# Patient Record
Sex: Male | Born: 1945 | Race: White | Hispanic: No | Smoking: Former smoker
Health system: Southern US, Community
[De-identification: ages and names within clinical notes are randomized; demographics above are authoritative.]

## PROBLEM LIST (undated history)

## (undated) DIAGNOSIS — I499 Cardiac arrhythmia, unspecified: Secondary | ICD-10-CM

## (undated) DIAGNOSIS — Z8719 Personal history of other diseases of the digestive system: Secondary | ICD-10-CM

## (undated) DIAGNOSIS — E119 Type 2 diabetes mellitus without complications: Secondary | ICD-10-CM

## (undated) DIAGNOSIS — M1990 Unspecified osteoarthritis, unspecified site: Secondary | ICD-10-CM

## (undated) DIAGNOSIS — I10 Essential (primary) hypertension: Secondary | ICD-10-CM

## (undated) DIAGNOSIS — M87051 Idiopathic aseptic necrosis of right femur: Secondary | ICD-10-CM

## (undated) DIAGNOSIS — K219 Gastro-esophageal reflux disease without esophagitis: Secondary | ICD-10-CM

## (undated) DIAGNOSIS — K2270 Barrett's esophagus without dysplasia: Secondary | ICD-10-CM

## (undated) HISTORY — PX: OTHER SURGICAL HISTORY: SHX169

## (undated) HISTORY — PX: CHOLECYSTECTOMY: SHX55

## (undated) HISTORY — PX: JOINT REPLACEMENT: SHX530

---

## 2017-06-02 DEATH — deceased

## 2018-04-25 NOTE — Patient Instructions (Signed)
Jeffrey Garza  04/25/2018   Your procedure is scheduled on: 05-09-18  Report to [facility] Main  Entrance  Report to admitting at      0530 AM    Call this number if you have problems the morning of surgery [phone]    Remember: Do not eat food or drink liquids :After Midnight. BRUSH YOUR TEETH MORNING OF SURGERY AND RINSE YOUR MOUTH OUT, NO CHEWING GUM CANDY OR MINTS.     Take these medicines the morning of surgery with A SIP OF WATER: metoprolol, protonix, crestor DO NOT TAKE ANY DIABETIC MEDICATIONS DAY OF YOUR SURGERY                               You may not have any metal on your body including hair pins and              piercings  Do not wear jewelry, lotions, powders or perfumes, deodorant                        Men may shave face and neck.   Do not bring valuables to the hospital. [name] IS NOT             RESPONSIBLE   FOR VALUABLES.  Contacts, dentures or bridgework may not be worn into surgery.  Leave suitcase in the car. After surgery it may be brought to your room.               Please read over the following fact sheets you were given: _____________________________________________________________________           [facility] - Preparing for Surgery Before surgery, you can play an important role.  Because skin is not sterile, your skin needs to be as free of germs as possible.  You can reduce the number of germs on your skin by washing with CHG (chlorahexidine gluconate) soap before surgery.  CHG is an antiseptic cleaner which kills germs and bonds with the skin to continue killing germs even after washing. Please DO NOT use if you have an allergy to CHG or antibacterial soaps.  If your skin becomes reddened/irritated stop using the CHG and inform your nurse when you arrive at Short Stay. Do not shave (including legs and underarms) for at least 48 hours prior to the first CHG shower.  You may shave your face/neck. Please follow these  instructions carefully:  1.  Shower with CHG Soap the night before surgery and the  morning of Surgery.  2.  If you choose to wash your hair, wash your hair first as usual with your  normal  shampoo.  3.  After you shampoo, rinse your hair and body thoroughly to remove the  shampoo.                           4.  Use CHG as you would any other liquid soap.  You can apply chg directly  to the skin and wash                       Gently with a scrungie or clean washcloth.  5.  Apply the CHG Soap to your body ONLY FROM THE NECK DOWN.   Do not use on  face/ open                           Wound or open sores. Avoid contact with eyes, ears mouth and genitals (private parts).                       Wash face,  Genitals (private parts) with your normal soap.             6.  Wash thoroughly, paying special attention to the area where your surgery  will be performed.  7.  Thoroughly rinse your body with warm water from the neck down.  8.  DO NOT shower/wash with your normal soap after using and rinsing off  the CHG Soap.                9.  Pat yourself dry with a clean towel.            10.  Wear clean pajamas.            11.  Place clean sheets on your bed the night of your first shower and do not  sleep with pets. Day of Surgery : Do not apply any lotions/deodorants the morning of surgery.  Please wear clean clothes to the hospital/surgery center.  FAILURE TO FOLLOW THESE INSTRUCTIONS MAY RESULT IN THE CANCELLATION OF YOUR SURGERY PATIENT SIGNATURE_________________________________  NURSE SIGNATURE__________________________________  ________________________________________________________________________   Jeffrey Garza  An incentive spirometer is a tool that can help keep your lungs clear and active. This tool measures how well you are filling your lungs with each breath. Taking long deep breaths may help reverse or decrease the chance of developing breathing (pulmonary) problems (especially  infection) following:  A long period of time when you are unable to move or be active. BEFORE THE PROCEDURE   If the spirometer includes an indicator to show your best effort, your nurse or respiratory therapist will set it to a desired goal.  If possible, sit up straight or lean slightly forward. Try not to slouch.  Hold the incentive spirometer in an upright position. INSTRUCTIONS FOR USE  1. Sit on the edge of your bed if possible, or sit up as far as you can in bed or on a chair. 2. Hold the incentive spirometer in an upright position. 3. Breathe out normally. 4. Place the mouthpiece in your mouth and seal your lips tightly around it. 5. Breathe in slowly and as deeply as possible, raising the piston or the ball toward the top of the column. 6. Hold your breath for 3-5 seconds or for as long as possible. Allow the piston or ball to fall to the bottom of the column. 7. Remove the mouthpiece from your mouth and breathe out normally. 8. Rest for a few seconds and repeat Steps 1 through 7 at least 10 times every 1-2 hours when you are awake. Take your time and take a few normal breaths between deep breaths. 9. The spirometer may include an indicator to show your best effort. Use the indicator as a goal to work toward during each repetition. 10. After each set of 10 deep breaths, practice coughing to be sure your lungs are clear. If you have an incision (the cut made at the time of surgery), support your incision when coughing by placing a pillow or rolled up towels firmly against it. Once you are able to get out of bed, walk around indoors  and cough well. You may stop using the incentive spirometer when instructed by your caregiver.  RISKS AND COMPLICATIONS  Take your time so you do not get dizzy or light-headed.  If you are in pain, you may need to take or ask for pain medication before doing incentive spirometry. It is harder to take a deep breath if you are having pain. AFTER  USE  Rest and breathe slowly and easily.  It can be helpful to keep track of a log of your progress. Your caregiver can provide you with a simple table to help with this. If you are using the spirometer at home, follow these instructions: SEEK MEDICAL CARE IF:   You are having difficultly using the spirometer.  You have trouble using the spirometer as often as instructed.  Your pain medication is not giving enough relief while using the spirometer.  You develop fever of 100.5 F (38.1 C) or higher. SEEK IMMEDIATE MEDICAL CARE IF:   You cough up bloody sputum that had not been present before.  You develop fever of 102 F (38.9 C) or greater.  You develop worsening pain at or near the incision site. MAKE SURE YOU:   Understand these instructions.  Will watch your condition.  Will get help right away if you are not doing well or get worse. Document Released: 11/29/2006 Document Revised: 10/11/2011 Document Reviewed: 01/30/2007 [facility] Patient Information 2014 [place], [place].   ________________________________________________________________________

## 2018-04-25 NOTE — Progress Notes (Signed)
Please place orders in epic pt. Has a preop appt 04-26-18 Thank You!

## 2018-04-26 ENCOUNTER — Encounter (INDEPENDENT_AMBULATORY_CARE_PROVIDER_SITE_OTHER): Payer: Self-pay

## 2018-04-26 ENCOUNTER — Other Ambulatory Visit: Payer: Self-pay

## 2018-04-26 ENCOUNTER — Encounter (HOSPITAL_COMMUNITY)
Admission: RE | Admit: 2018-04-26 | Discharge: 2018-04-26 | Disposition: A | Discharge status: Home / Self Care | Payer: Medicare Other | Source: Ambulatory Visit | Attending: Orthopedic Surgery | Admitting: Orthopedic Surgery

## 2018-04-26 ENCOUNTER — Encounter (HOSPITAL_COMMUNITY): Payer: Self-pay

## 2018-04-26 DIAGNOSIS — M8788 Other osteonecrosis, other site: Secondary | ICD-10-CM | POA: Diagnosis not present

## 2018-04-26 DIAGNOSIS — E119 Type 2 diabetes mellitus without complications: Secondary | ICD-10-CM | POA: Insufficient documentation

## 2018-04-26 DIAGNOSIS — M1611 Unilateral primary osteoarthritis, right hip: Secondary | ICD-10-CM | POA: Diagnosis not present

## 2018-04-26 DIAGNOSIS — Z01818 Encounter for other preprocedural examination: Secondary | ICD-10-CM | POA: Diagnosis present

## 2018-04-26 HISTORY — DX: Barrett's esophagus without dysplasia: K22.70

## 2018-04-26 HISTORY — DX: Unspecified osteoarthritis, unspecified site: M19.90

## 2018-04-26 HISTORY — DX: Personal history of other diseases of the digestive system: Z87.19

## 2018-04-26 HISTORY — DX: Cardiac arrhythmia, unspecified: I49.9

## 2018-04-26 HISTORY — DX: Gastro-esophageal reflux disease without esophagitis: K21.9

## 2018-04-26 HISTORY — DX: Type 2 diabetes mellitus without complications: E11.9

## 2018-04-26 HISTORY — DX: Essential (primary) hypertension: I10

## 2018-04-26 LAB — BASIC METABOLIC PANEL
ANION GAP: 7 (ref 5–15)
BUN: 18 mg/dL (ref 8–23)
CO2: 26 mmol/L (ref 22–32)
Calcium: 9.2 mg/dL (ref 8.9–10.3)
Chloride: 108 mmol/L (ref 98–111)
Creatinine, Ser: 0.96 mg/dL (ref 0.61–1.24)
GFR calc Af Amer: >60 mL/min (ref >60)
Glucose, Bld: 121 mg/dL — ABNORMAL HIGH (ref 70–99)
POTASSIUM: 4.8 mmol/L (ref 3.5–5.1)
SODIUM: 141 mmol/L (ref 135–145)

## 2018-04-26 LAB — SURGICAL PCR SCREEN
MRSA, PCR: NEGATIVE
STAPHYLOCOCCUS AUREUS: NEGATIVE

## 2018-04-26 LAB — CBC
HEMATOCRIT: 41.6 % (ref 39.0–52.0)
HEMOGLOBIN: 13.7 g/dL (ref 13.0–17.0)
MCH: 30.2 pg (ref 26.0–34.0)
MCHC: 32.9 g/dL (ref 30.0–36.0)
MCV: 91.6 fL (ref 78.0–100.0)
Platelets: 172 10*3/uL (ref 150–400)
RBC: 4.54 MIL/uL (ref 4.22–5.81)
RDW: 13.5 % (ref 11.5–15.5)
WBC: 8.3 10*3/uL (ref 4.0–10.5)

## 2018-04-26 LAB — HEMOGLOBIN A1C
Hgb A1c MFr Bld: 6.1 % — ABNORMAL HIGH (ref 4.8–5.6)
MEAN PLASMA GLUCOSE: 128.37 mg/dL

## 2018-04-26 NOTE — Progress Notes (Signed)
Clearance  03-09-18 Cardiology on chart  LOV and ekg 08-31-17 on chart

## 2018-04-26 NOTE — Progress Notes (Signed)
Left VM with Cordelia Pen at Dr. Darla Lesches office to inform her pt. Did not have instructions on stopping Elequis. Per Cardiologist clearance note on chart pt. To stop 48 hours preop. LVM with Cordelia Pen Dr. Darla Lesches office to inform pt. Of any different instructions per Dr. Dion Saucier .

## 2018-04-26 NOTE — Progress Notes (Signed)
LOV note PCP 03-07-18 in chart.

## 2018-05-04 ENCOUNTER — Other Ambulatory Visit: Payer: Self-pay | Admitting: Orthopedic Surgery

## 2018-05-04 NOTE — Care Plan (Signed)
Spoke with patient prior to surgery. He will discharge to home with family the following:   HHPT:   [facility] -- Phone # [phone]                  Fax# [phone]       Referral given to Diane.  OPPT:  [facility] on Louretta Parma 05/23/18 @ 1000.   Patient is aware and agreeable to above.   Please let me know if you have questions  Shauna Hugh, RNCM [phone]

## 2018-05-09 ENCOUNTER — Encounter (HOSPITAL_COMMUNITY): Payer: Self-pay | Admitting: *Deleted

## 2018-05-09 ENCOUNTER — Encounter (HOSPITAL_COMMUNITY)
Admission: RE | Disposition: A | Discharge status: Home / Self Care | Payer: Self-pay | Source: Home / Self Care | Attending: Orthopedic Surgery

## 2018-05-09 ENCOUNTER — Inpatient Hospital Stay (HOSPITAL_COMMUNITY): Payer: Medicare Other | Admitting: Anesthesiology

## 2018-05-09 ENCOUNTER — Other Ambulatory Visit: Payer: Self-pay

## 2018-05-09 ENCOUNTER — Inpatient Hospital Stay (HOSPITAL_COMMUNITY): Payer: Medicare Other

## 2018-05-09 ENCOUNTER — Inpatient Hospital Stay (HOSPITAL_COMMUNITY)
Admission: RE | Admit: 2018-05-09 | Discharge: 2018-05-11 | DRG: 470 | Disposition: A | Discharge status: Home / Self Care | Payer: Medicare Other | Attending: Orthopedic Surgery | Admitting: Orthopedic Surgery

## 2018-05-09 DIAGNOSIS — Z79899 Other long term (current) drug therapy: Secondary | ICD-10-CM

## 2018-05-09 DIAGNOSIS — K219 Gastro-esophageal reflux disease without esophagitis: Secondary | ICD-10-CM | POA: Diagnosis present

## 2018-05-09 DIAGNOSIS — I10 Essential (primary) hypertension: Secondary | ICD-10-CM | POA: Diagnosis present

## 2018-05-09 DIAGNOSIS — Z9049 Acquired absence of other specified parts of digestive tract: Secondary | ICD-10-CM | POA: Diagnosis not present

## 2018-05-09 DIAGNOSIS — Z87891 Personal history of nicotine dependence: Secondary | ICD-10-CM | POA: Diagnosis not present

## 2018-05-09 DIAGNOSIS — M87051 Idiopathic aseptic necrosis of right femur: Secondary | ICD-10-CM | POA: Diagnosis present

## 2018-05-09 DIAGNOSIS — M1610 Unilateral primary osteoarthritis, unspecified hip: Secondary | ICD-10-CM | POA: Diagnosis present

## 2018-05-09 DIAGNOSIS — I4891 Unspecified atrial fibrillation: Secondary | ICD-10-CM | POA: Diagnosis present

## 2018-05-09 DIAGNOSIS — Z96649 Presence of unspecified artificial hip joint: Secondary | ICD-10-CM

## 2018-05-09 DIAGNOSIS — Z880 Allergy status to penicillin: Secondary | ICD-10-CM

## 2018-05-09 DIAGNOSIS — E119 Type 2 diabetes mellitus without complications: Secondary | ICD-10-CM | POA: Diagnosis present

## 2018-05-09 DIAGNOSIS — Z7901 Long term (current) use of anticoagulants: Secondary | ICD-10-CM | POA: Diagnosis not present

## 2018-05-09 DIAGNOSIS — K2270 Barrett's esophagus without dysplasia: Secondary | ICD-10-CM | POA: Diagnosis present

## 2018-05-09 DIAGNOSIS — M8788 Other osteonecrosis, other site: Secondary | ICD-10-CM | POA: Diagnosis present

## 2018-05-09 HISTORY — DX: Idiopathic aseptic necrosis of right femur: M87.051

## 2018-05-09 HISTORY — PX: TOTAL HIP ARTHROPLASTY: SHX124

## 2018-05-09 LAB — GLUCOSE, CAPILLARY
Glucose-Capillary: 113 mg/dL — ABNORMAL HIGH (ref 70–99)
Glucose-Capillary: 174 mg/dL — ABNORMAL HIGH (ref 70–99)

## 2018-05-09 SURGERY — ARTHROPLASTY, HIP, TOTAL,POSTERIOR APPROACH
Anesthesia: General | Site: Hip | Laterality: Right

## 2018-05-09 MED ORDER — LIDOCAINE HCL (CARDIAC) PF 100 MG/5ML IV SOSY
PREFILLED_SYRINGE | INTRAVENOUS | Status: DC | PRN
  Administered 2018-05-09: 60 mg via INTRAVENOUS

## 2018-05-09 MED ORDER — MIDAZOLAM HCL 5 MG/5ML IJ SOLN
INTRAMUSCULAR | Status: DC | PRN
  Administered 2018-05-09: 1 mg via INTRAVENOUS

## 2018-05-09 MED ORDER — METOCLOPRAMIDE HCL 5 MG/ML IJ SOLN: 5.0000 mg | Freq: Three times a day (TID) | INTRAMUSCULAR | Status: DC | PRN

## 2018-05-09 MED ORDER — TRANEXAMIC ACID 1000 MG/10ML IV SOLN
1000.0000 mg | Freq: Once | INTRAVENOUS | Status: AC
  Administered 2018-05-09: 1000 mg via INTRAVENOUS
  Filled 2018-05-09: qty 1000

## 2018-05-09 MED ORDER — FENTANYL CITRATE (PF) 100 MCG/2ML IJ SOLN
25.0000 ug | INTRAMUSCULAR | Status: DC | PRN
  Administered 2018-05-09 (×3): 50 ug via INTRAVENOUS

## 2018-05-09 MED ORDER — ACETAMINOPHEN 500 MG PO TABS
500.0000 mg | ORAL_TABLET | Freq: Four times a day (QID) | ORAL | Status: AC
  Administered 2018-05-09 – 2018-05-10 (×3): 500 mg via ORAL
  Filled 2018-05-09 (×3): qty 1

## 2018-05-09 MED ORDER — ONDANSETRON HCL 4 MG/2ML IJ SOLN
INTRAMUSCULAR | Status: AC
  Filled 2018-05-09: qty 2

## 2018-05-09 MED ORDER — HYDROCODONE-ACETAMINOPHEN 5-325 MG PO TABS
ORAL_TABLET | ORAL | Status: AC
  Filled 2018-05-09: qty 2

## 2018-05-09 MED ORDER — FOLIC ACID 0.5 MG HALF TAB
500.0000 ug | ORAL_TABLET | Freq: Every day | ORAL | Status: DC
  Filled 2018-05-09: qty 1

## 2018-05-09 MED ORDER — ACETAMINOPHEN 500 MG PO TABS
1000.0000 mg | ORAL_TABLET | Freq: Once | ORAL | Status: AC
  Administered 2018-05-09: 1000 mg via ORAL
  Filled 2018-05-09: qty 2

## 2018-05-09 MED ORDER — DEXAMETHASONE SODIUM PHOSPHATE 10 MG/ML IJ SOLN
10.0000 mg | Freq: Once | INTRAMUSCULAR | Status: AC
  Administered 2018-05-10: 10 mg via INTRAVENOUS
  Filled 2018-05-09: qty 1

## 2018-05-09 MED ORDER — PHENOL 1.4 % MT LIQD: 1.0000 | OROMUCOSAL | Status: DC | PRN

## 2018-05-09 MED ORDER — METHOCARBAMOL 500 MG IVPB - SIMPLE MED
500.0000 mg | Freq: Four times a day (QID) | INTRAVENOUS | Status: DC | PRN
  Administered 2018-05-09: 500 mg via INTRAVENOUS
  Filled 2018-05-09: qty 50

## 2018-05-09 MED ORDER — HYDROCODONE-ACETAMINOPHEN 7.5-325 MG PO TABS
1.0000 | ORAL_TABLET | ORAL | Status: DC | PRN
  Administered 2018-05-09 – 2018-05-10 (×2): 1 via ORAL
  Filled 2018-05-09 (×2): qty 1

## 2018-05-09 MED ORDER — 0.9 % SODIUM CHLORIDE (POUR BTL) OPTIME
TOPICAL | Status: DC | PRN
  Administered 2018-05-09: 1000 mL

## 2018-05-09 MED ORDER — ACETAMINOPHEN 325 MG PO TABS: 325.0000 mg | ORAL_TABLET | Freq: Four times a day (QID) | ORAL | Status: DC | PRN

## 2018-05-09 MED ORDER — MIDAZOLAM HCL 2 MG/2ML IJ SOLN
INTRAMUSCULAR | Status: AC
  Filled 2018-05-09: qty 2

## 2018-05-09 MED ORDER — POLYETHYLENE GLYCOL 3350 17 G PO PACK: 17.0000 g | PACK | Freq: Every day | ORAL | Status: DC | PRN

## 2018-05-09 MED ORDER — PROPOFOL 10 MG/ML IV BOLUS
INTRAVENOUS | Status: AC
  Filled 2018-05-09: qty 40

## 2018-05-09 MED ORDER — HYDROCODONE-ACETAMINOPHEN 10-325 MG PO TABS: 1.0000 | ORAL_TABLET | Freq: Four times a day (QID) | ORAL | 0 refills | Status: AC | PRN

## 2018-05-09 MED ORDER — METOCLOPRAMIDE HCL 5 MG/ML IJ SOLN: 10.0000 mg | Freq: Once | INTRAMUSCULAR | Status: DC | PRN

## 2018-05-09 MED ORDER — DIPHENHYDRAMINE HCL 12.5 MG/5ML PO ELIX: 12.5000 mg | ORAL_SOLUTION | ORAL | Status: DC | PRN

## 2018-05-09 MED ORDER — GABAPENTIN 300 MG PO CAPS
300.0000 mg | ORAL_CAPSULE | Freq: Once | ORAL | Status: AC
  Administered 2018-05-09: 300 mg via ORAL
  Filled 2018-05-09: qty 1

## 2018-05-09 MED ORDER — STERILE WATER FOR IRRIGATION IR SOLN
Status: DC | PRN
  Administered 2018-05-09: 3000 mL

## 2018-05-09 MED ORDER — MORPHINE SULFATE (PF) 2 MG/ML IV SOLN: 0.5000 mg | INTRAVENOUS | Status: DC | PRN

## 2018-05-09 MED ORDER — ONDANSETRON HCL 4 MG/2ML IJ SOLN
INTRAMUSCULAR | Status: DC | PRN
  Administered 2018-05-09: 4 mg via INTRAVENOUS

## 2018-05-09 MED ORDER — ROCURONIUM BROMIDE 10 MG/ML (PF) SYRINGE
PREFILLED_SYRINGE | INTRAVENOUS | Status: DC | PRN
  Administered 2018-05-09: 100 mg via INTRAVENOUS
  Administered 2018-05-09: 20 mg via INTRAVENOUS

## 2018-05-09 MED ORDER — CEFAZOLIN SODIUM-DEXTROSE 2-4 GM/100ML-% IV SOLN
2.0000 g | Freq: Four times a day (QID) | INTRAVENOUS | Status: AC
  Administered 2018-05-09 (×2): 2 g via INTRAVENOUS
  Filled 2018-05-09 (×2): qty 100

## 2018-05-09 MED ORDER — HYDROMORPHONE HCL 1 MG/ML IJ SOLN
INTRAMUSCULAR | Status: AC
  Filled 2018-05-09: qty 1

## 2018-05-09 MED ORDER — LIDOCAINE HCL (CARDIAC) PF 100 MG/5ML IV SOSY
PREFILLED_SYRINGE | INTRAVENOUS | Status: AC
  Filled 2018-05-09: qty 5

## 2018-05-09 MED ORDER — MAGNESIUM CITRATE PO SOLN: 1.0000 | Freq: Once | ORAL | Status: DC | PRN

## 2018-05-09 MED ORDER — ALUM & MAG HYDROXIDE-SIMETH 200-200-20 MG/5ML PO SUSP: 30.0000 mL | ORAL | Status: DC | PRN

## 2018-05-09 MED ORDER — SENNA-DOCUSATE SODIUM 8.6-50 MG PO TABS: 2.0000 | ORAL_TABLET | Freq: Every day | ORAL | 1 refills | Status: AC

## 2018-05-09 MED ORDER — CEFAZOLIN SODIUM-DEXTROSE 2-4 GM/100ML-% IV SOLN
2.0000 g | INTRAVENOUS | Status: AC
  Administered 2018-05-09: 2 g via INTRAVENOUS
  Filled 2018-05-09: qty 100

## 2018-05-09 MED ORDER — BUPIVACAINE HCL (PF) 0.25 % IJ SOLN
INTRAMUSCULAR | Status: DC | PRN
  Administered 2018-05-09: 30 mL

## 2018-05-09 MED ORDER — METOCLOPRAMIDE HCL 5 MG PO TABS: 5.0000 mg | ORAL_TABLET | Freq: Three times a day (TID) | ORAL | Status: DC | PRN

## 2018-05-09 MED ORDER — BISACODYL 10 MG RE SUPP: 10.0000 mg | Freq: Every day | RECTAL | Status: DC | PRN

## 2018-05-09 MED ORDER — METHOCARBAMOL 500 MG PO TABS
500.0000 mg | ORAL_TABLET | Freq: Four times a day (QID) | ORAL | Status: DC | PRN
  Administered 2018-05-09 – 2018-05-11 (×2): 500 mg via ORAL
  Filled 2018-05-09 (×2): qty 1

## 2018-05-09 MED ORDER — POTASSIUM CHLORIDE IN NACL 20-0.45 MEQ/L-% IV SOLN
INTRAVENOUS | Status: DC
  Administered 2018-05-09: 16:00:00 via INTRAVENOUS
  Filled 2018-05-09 (×2): qty 1000

## 2018-05-09 MED ORDER — ROSUVASTATIN CALCIUM 20 MG PO TABS
20.0000 mg | ORAL_TABLET | Freq: Every day | ORAL | Status: DC
  Administered 2018-05-10 – 2018-05-11 (×2): 20 mg via ORAL
  Filled 2018-05-09 (×2): qty 1

## 2018-05-09 MED ORDER — DEXAMETHASONE SODIUM PHOSPHATE 10 MG/ML IJ SOLN
8.0000 mg | Freq: Once | INTRAMUSCULAR | Status: AC
  Administered 2018-05-09: 8 mg via INTRAVENOUS

## 2018-05-09 MED ORDER — ROCURONIUM BROMIDE 100 MG/10ML IV SOLN
INTRAVENOUS | Status: AC
  Filled 2018-05-09: qty 1

## 2018-05-09 MED ORDER — FENTANYL CITRATE (PF) 100 MCG/2ML IJ SOLN
INTRAMUSCULAR | Status: DC | PRN
  Administered 2018-05-09: 100 ug via INTRAVENOUS
  Administered 2018-05-09 (×3): 50 ug via INTRAVENOUS

## 2018-05-09 MED ORDER — DEXAMETHASONE SODIUM PHOSPHATE 10 MG/ML IJ SOLN
INTRAMUSCULAR | Status: AC
  Filled 2018-05-09: qty 1

## 2018-05-09 MED ORDER — FENTANYL CITRATE (PF) 100 MCG/2ML IJ SOLN
INTRAMUSCULAR | Status: AC
  Filled 2018-05-09: qty 4

## 2018-05-09 MED ORDER — ONDANSETRON HCL 4 MG PO TABS: 4.0000 mg | ORAL_TABLET | Freq: Four times a day (QID) | ORAL | Status: DC | PRN

## 2018-05-09 MED ORDER — METHOCARBAMOL 500 MG IVPB - SIMPLE MED
INTRAVENOUS | Status: AC
  Filled 2018-05-09: qty 50

## 2018-05-09 MED ORDER — DOCUSATE SODIUM 100 MG PO CAPS
100.0000 mg | ORAL_CAPSULE | Freq: Two times a day (BID) | ORAL | Status: DC
  Administered 2018-05-09 – 2018-05-11 (×4): 100 mg via ORAL
  Filled 2018-05-09 (×4): qty 1

## 2018-05-09 MED ORDER — ZOLPIDEM TARTRATE 5 MG PO TABS: 5.0000 mg | ORAL_TABLET | Freq: Every evening | ORAL | Status: DC | PRN

## 2018-05-09 MED ORDER — MEPERIDINE HCL 50 MG/ML IJ SOLN: 6.2500 mg | INTRAMUSCULAR | Status: DC | PRN

## 2018-05-09 MED ORDER — FENTANYL CITRATE (PF) 250 MCG/5ML IJ SOLN
INTRAMUSCULAR | Status: AC
  Filled 2018-05-09: qty 5

## 2018-05-09 MED ORDER — ONDANSETRON HCL 4 MG PO TABS: 4.0000 mg | ORAL_TABLET | Freq: Three times a day (TID) | ORAL | 0 refills | Status: AC | PRN

## 2018-05-09 MED ORDER — BUPIVACAINE HCL (PF) 0.25 % IJ SOLN
INTRAMUSCULAR | Status: AC
  Filled 2018-05-09: qty 30

## 2018-05-09 MED ORDER — HYDROMORPHONE HCL 1 MG/ML IJ SOLN
0.2500 mg | INTRAMUSCULAR | Status: DC | PRN
  Administered 2018-05-09 (×4): 0.5 mg via INTRAVENOUS

## 2018-05-09 MED ORDER — LACTATED RINGERS IV SOLN
INTRAVENOUS | Status: DC
  Administered 2018-05-09 (×2): via INTRAVENOUS
  Administered 2018-05-09: 1000 mL via INTRAVENOUS

## 2018-05-09 MED ORDER — MENTHOL 3 MG MT LOZG: 1.0000 | LOZENGE | OROMUCOSAL | Status: DC | PRN

## 2018-05-09 MED ORDER — APIXABAN 5 MG PO TABS
5.0000 mg | ORAL_TABLET | Freq: Two times a day (BID) | ORAL | Status: DC
  Administered 2018-05-10 – 2018-05-11 (×3): 5 mg via ORAL
  Filled 2018-05-09 (×3): qty 1

## 2018-05-09 MED ORDER — BACLOFEN 10 MG PO TABS: 10.0000 mg | ORAL_TABLET | Freq: Three times a day (TID) | ORAL | 0 refills | Status: AC

## 2018-05-09 MED ORDER — SUGAMMADEX SODIUM 500 MG/5ML IV SOLN
INTRAVENOUS | Status: AC
  Filled 2018-05-09: qty 5

## 2018-05-09 MED ORDER — SUGAMMADEX SODIUM 500 MG/5ML IV SOLN
INTRAVENOUS | Status: DC | PRN
  Administered 2018-05-09: 300 mg via INTRAVENOUS

## 2018-05-09 MED ORDER — HYDROCODONE-ACETAMINOPHEN 5-325 MG PO TABS
1.0000 | ORAL_TABLET | ORAL | Status: DC | PRN
  Administered 2018-05-09 – 2018-05-11 (×5): 2 via ORAL
  Filled 2018-05-09 (×4): qty 2

## 2018-05-09 MED ORDER — METOPROLOL TARTRATE 25 MG PO TABS
25.0000 mg | ORAL_TABLET | Freq: Two times a day (BID) | ORAL | Status: DC
  Administered 2018-05-09 – 2018-05-11 (×4): 25 mg via ORAL
  Filled 2018-05-09 (×4): qty 1

## 2018-05-09 MED ORDER — FENTANYL CITRATE (PF) 100 MCG/2ML IJ SOLN
INTRAMUSCULAR | Status: AC
  Filled 2018-05-09: qty 2

## 2018-05-09 MED ORDER — PANTOPRAZOLE SODIUM 40 MG PO TBEC
40.0000 mg | DELAYED_RELEASE_TABLET | Freq: Every day | ORAL | Status: DC
  Administered 2018-05-10 – 2018-05-11 (×2): 40 mg via ORAL
  Filled 2018-05-09 (×2): qty 1

## 2018-05-09 MED ORDER — ONDANSETRON HCL 4 MG/2ML IJ SOLN: 4.0000 mg | Freq: Four times a day (QID) | INTRAMUSCULAR | Status: DC | PRN

## 2018-05-09 MED ORDER — KETOROLAC TROMETHAMINE 15 MG/ML IJ SOLN
7.5000 mg | Freq: Four times a day (QID) | INTRAMUSCULAR | Status: AC
  Administered 2018-05-09 – 2018-05-10 (×3): 7.5 mg via INTRAVENOUS
  Filled 2018-05-09 (×3): qty 1

## 2018-05-09 MED ORDER — PROPOFOL 10 MG/ML IV BOLUS
INTRAVENOUS | Status: DC | PRN
  Administered 2018-05-09: 120 mg via INTRAVENOUS

## 2018-05-09 SURGICAL SUPPLY — 57 items
BIT DRILL 2.4X128 (BIT) ×2 IMPLANT
BLADE SAW SGTL 73X25 THK (BLADE) ×2 IMPLANT
COVER SURGICAL LIGHT HANDLE (MISCELLANEOUS) ×2 IMPLANT
CUP ACET PNNCL SECTR W/GRIP 56 (Hips) ×1 IMPLANT
DRAPE INCISE IOBAN 66X45 STRL (DRAPES) ×2 IMPLANT
DRAPE ORTHO SPLIT 77X108 STRL (DRAPES) ×2
DRAPE POUCH INSTRU U-SHP 10X18 (DRAPES) ×2 IMPLANT
DRAPE SHEET LG 3/4 BI-LAMINATE (DRAPES) IMPLANT
DRAPE SURG ORHT 6 SPLT 77X108 (DRAPES) ×2 IMPLANT
DRAPE U-SHAPE 47X51 STRL (DRAPES) ×2 IMPLANT
DRSG MEPILEX BORDER 4X8 (GAUZE/BANDAGES/DRESSINGS) ×2 IMPLANT
DURAPREP 26ML APPLICATOR (WOUND CARE) ×4 IMPLANT
ELECT BLADE TIP CTD 4 INCH (ELECTRODE) ×2 IMPLANT
ELECT REM PT RETURN 15FT ADLT (MISCELLANEOUS) ×2 IMPLANT
ELIMINATOR HOLE APEX DEPUY (Hips) ×2 IMPLANT
GLOVE BIO SURGEON STRL SZ7.5 (GLOVE) ×2 IMPLANT
GLOVE BIO SURGEON STRL SZ8 (GLOVE) ×2 IMPLANT
GLOVE BIOGEL PI IND STRL 8 (GLOVE) ×2 IMPLANT
GLOVE BIOGEL PI INDICATOR 8 (GLOVE) ×2
GOWN STRL REUS W/TWL LRG LVL3 (GOWN DISPOSABLE) ×2 IMPLANT
GOWN STRL REUS W/TWL XL LVL3 (GOWN DISPOSABLE) ×2 IMPLANT
HEAD M SROM 36MM PLUS 1.5 (Hips) ×1 IMPLANT
HOOD PEEL AWAY FLYTE STAYCOOL (MISCELLANEOUS) ×8 IMPLANT
KIT BASIN OR (CUSTOM PROCEDURE TRAY) ×2 IMPLANT
MANIFOLD NEPTUNE II (INSTRUMENTS) ×2 IMPLANT
NEEDLE HYPO 22GX1.5 SAFETY (NEEDLE) ×2 IMPLANT
NS IRRIG 1000ML POUR BTL (IV SOLUTION) ×2 IMPLANT
PACK TOTAL JOINT (CUSTOM PROCEDURE TRAY) ×2 IMPLANT
PINN SECTOR W/GRIP ACE CUP 56 (Hips) ×2 IMPLANT
PINNACLE ALTRX PLUS 4 N 36X56 (Hips) ×2 IMPLANT
POSITIONER SURGICAL ARM (MISCELLANEOUS) ×2 IMPLANT
RETRIEVER SUT HEWSON (MISCELLANEOUS) ×2 IMPLANT
SCREW 6.5MMX25MM (Screw) ×2 IMPLANT
SPONGE LAP 18X18 RF (DISPOSABLE) IMPLANT
SROM M HEAD 36MM PLUS 1.5 (Hips) ×2 IMPLANT
STEM HIP W/DUOFIX (Stem) ×2 IMPLANT
STRIP CLOSURE SKIN 1/2X4 (GAUZE/BANDAGES/DRESSINGS) ×4 IMPLANT
SUCTION FRAZIER HANDLE 12FR (TUBING)
SUCTION TUBE FRAZIER 12FR DISP (TUBING) IMPLANT
SUT FIBERWIRE #2 38 T-5 BLUE (SUTURE) ×6
SUT MNCRL AB 4-0 PS2 18 (SUTURE) ×2 IMPLANT
SUT VIC AB 0 CT1 18XCR BRD 8 (SUTURE) IMPLANT
SUT VIC AB 0 CT1 36 (SUTURE) IMPLANT
SUT VIC AB 0 CT1 8-18 (SUTURE)
SUT VIC AB 1 CT1 27 (SUTURE)
SUT VIC AB 1 CT1 27XBRD ANTBC (SUTURE) IMPLANT
SUT VIC AB 2-0 CT1 27 (SUTURE) ×2
SUT VIC AB 2-0 CT1 TAPERPNT 27 (SUTURE) ×2 IMPLANT
SUT VIC AB 3-0 SH 8-18 (SUTURE) ×2 IMPLANT
SUT VIC AB 4-0 PS2 18 (SUTURE) ×2 IMPLANT
SUTURE FIBERWR #2 38 T-5 BLUE (SUTURE) ×3 IMPLANT
SYR 30ML LL (SYRINGE) IMPLANT
SYR CONTROL 10ML LL (SYRINGE) ×2 IMPLANT
TOWEL OR 17X26 10 PK STRL BLUE (TOWEL DISPOSABLE) ×2 IMPLANT
TOWEL OR NON WOVEN STRL DISP B (DISPOSABLE) IMPLANT
TRAY FOLEY MTR SLVR 16FR STAT (SET/KITS/TRAYS/PACK) ×2 IMPLANT
YANKAUER SUCT BULB TIP 10FT TU (MISCELLANEOUS) ×2 IMPLANT

## 2018-05-09 NOTE — H&P (Signed)
PREOPERATIVE H&P  Chief Complaint: right hip pain  HPI: Jeffrey Garza is a 72 y.o. male who presents for preoperative history and physical with a diagnosis of right hip avn. Symptoms are rated as moderate to severe, and have been worsening.  This is significantly impairing activities of daily living.  He has elected for surgical management.   He has failed injections, activity modification, anti-inflammatories, and assistive devices.  Preoperative MRI demonstrate AVN without significant collapse.   Past Medical History:  Diagnosis Date  . Arthritis   . Barrett's esophagus   . Diabetes mellitus without complication (HCC)    no meds / weight loss   . Dysrhythmia    a-fib  . GERD (gastroesophageal reflux disease)   . History of hiatal hernia   . Hypertension    Past Surgical History:  Procedure Laterality Date  . CHOLECYSTECTOMY    . JOINT REPLACEMENT     Right hip replacment dr. Dion Saucier 05-09-18  . rotar cuff repair     Left   Social History   Socioeconomic History  . Marital status: Divorced    Spouse name: Not on file  . Number of children: Not on file  . Years of education: Not on file  . Highest education level: Not on file  Occupational History  . Not on file  Social Needs  . Financial resource strain: Not on file  . Food insecurity:    Worry: Not on file    Inability: Not on file  . Transportation needs:    Medical: Not on file    Non-medical: Not on file  Tobacco Use  . Smoking status: Former Smoker    Years: 30.00    Types: Cigarettes  . Smokeless tobacco: Never Used  Substance and Sexual Activity  . Alcohol use: Not Currently    Comment: occasional  . Drug use: Not Currently  . Sexual activity: Not Currently  Lifestyle  . Physical activity:    Days per week: Not on file    Minutes per session: Not on file  . Stress: Not on file  Relationships  . Social connections:    Talks on phone: Not on file    Gets together: Not on file    Attends  religious service: Not on file    Active member of club or organization: Not on file    Attends meetings of clubs or organizations: Not on file    Relationship status: Not on file  Other Topics Concern  . Not on file  Social History Narrative  . Not on file   History reviewed. No pertinent family history. Allergies  Allergen Reactions  . Amoxicillin Rash    Has patient had a PCN reaction causing immediate rash, facial/tongue/throat swelling, SOB or lightheadedness with hypotension: Yes Has patient had a PCN reaction causing severe rash involving mucus membranes or skin necrosis: No Has patient had a PCN reaction that required hospitalization: Yes Has patient had a PCN reaction occurring within the last 10 years: No If all of the above answers are "NO", then may proceed with Cephalosporin use.    Prior to Admission medications   Medication Sig Start Date End Date Taking? Authorizing Provider  acetaminophen (TYLENOL) 500 MG tablet Take 500 mg by mouth every 6 (six) hours as needed (for pain/headaches).   Yes [provider]  apixaban (ELIQUIS) 5 MG TABS tablet Take 5 mg by mouth 2 (two) times daily.   Yes [provider]  folic acid (FOLVITE) 400  MCG tablet Take 400 mcg by mouth daily.   Yes [provider]  metoprolol tartrate (LOPRESSOR) 25 MG tablet Take 25 mg by mouth 2 (two) times daily.   Yes [provider]  pantoprazole (PROTONIX) 40 MG tablet Take 40 mg by mouth daily.   Yes [provider]  rosuvastatin (CRESTOR) 20 MG tablet Take 20 mg by mouth daily.   Yes [provider]     Positive ROS: All other systems have been reviewed and were otherwise negative with the exception of those mentioned in the HPI and as above.  Physical Exam: General: Alert, no acute distress Cardiovascular: No pedal edema Respiratory: No cyanosis, no use of accessory musculature GI: No organomegaly, abdomen is soft and non-tender Skin: No  lesions in the area of chief complaint Neurologic: Sensation intact distally Psychiatric: Patient is competent for consent with normal mood and affect Lymphatic: No axillary or cervical lymphadenopathy  MUSCULOSKELETAL: right hip painful arc of motion 0-80 with IR to 5.  Assessment: Right hip AVN   Plan: Plan for Procedure(s): RIGHT TOTAL HIP ARTHROPLASTY  The risks benefits and alternatives were discussed with the patient including but not limited to the risks of nonoperative treatment, versus surgical intervention including infection, bleeding, nerve injury, periprosthetic fracture, the need for revision surgery, dislocation, leg length discrepancy, blood clots, cardiopulmonary complications, morbidity, mortality, among others, and they were willing to proceed.     Preoperative templating of the joint replacement has been completed, documented, and submitted to the Operating Room personnel in order to optimize intra-operative equipment management.  Eulas Post, MD Cell [phone]   05/09/2018 7:22 AM

## 2018-05-09 NOTE — Evaluation (Signed)
Physical Therapy Evaluation Patient Details Name: Jeffrey Garza MRN: [id number] DOB: February 02, 1946 Today's Date: 05/09/2018   History of Present Illness  72 YO male s/p R THA posterior approach on 05/09/18. PMH includes DM, OA, afib, GERD, HTN, RTC repair.   Clinical Impression  Pt presents with decreased knowledge of posterior hip precautions, R hip pain with ambulation, difficulty performing mobility tasks, and decreased tolerance for ambulation. Acute PT to address deficits. Pt ambulated 30 ft with RW with min guard assist. PT reinforced posterior hip precautions throughout session, especially with transfers because pt has tendency to lean forward to stand. PT to continue to follow acutely, will progress mobility as able.     Follow Up Recommendations Follow surgeon's recommendation for DC plan and follow-up therapies;Supervision for mobility/OOB(HHPT)    Equipment Recommendations  Rolling walker with 5" wheels;3in1 (PT)    Recommendations for Other Services       Precautions / Restrictions Precautions Precautions: Fall;Posterior Hip Precaution Booklet Issued: Yes (comment) Precaution Comments: handout issued and reviewed with pt - no hip flexion >90*, no adduction, no intoeing/IR, and no crossing legs  Restrictions Weight Bearing Restrictions: No Other Position/Activity Restrictions: WBAT       Mobility  Bed Mobility Overal bed mobility: Needs Assistance Bed Mobility: Supine to Sit     Supine to sit: Min assist;HOB elevated     General bed mobility comments: Min assist for trunk elevation and LE management. Increased time/effort to scoot to EOB. Reinforcement of hip precautions at EOB.   Transfers Overall transfer level: Needs assistance Equipment used: Rolling walker (2 wheeled) Transfers: Sit to/from Stand Sit to Stand: Min assist;From elevated surface         General transfer comment: Pt cautioned to not lean forward to stand to avoid breaking hip precautions.  Verbal cuing for hand placement. Pt with self-steadying upon standing. Min assist for power up and reinforcement of precautions.   Ambulation/Gait Ambulation/Gait assistance: Min guard;+2 safety/equipment(chair follow ) Gait Distance (Feet): 30 Feet Assistive device: Rolling walker (2 wheeled) Gait Pattern/deviations: Step-to pattern;Decreased stride length;Decreased stance time - right;Decreased weight shift to right;Antalgic Gait velocity: decr    General Gait Details: Pt reporting increased soreness in hip with ambulation. Verbal reinforcements for posterior hip precautions throughout. Min guard for safety, repeated verbal cuing for placement in RW and sequencing.   Stairs            Wheelchair Mobility    Modified Rankin (Stroke Patients Only)       Balance Overall balance assessment: Mild deficits observed, not formally tested                                           Pertinent Vitals/Pain Pain Assessment: Faces Faces Pain Scale: Hurts little more Pain Location: R hip, during ambulation  Pain Descriptors / Indicators: Sore Pain Intervention(s): Limited activity within patient's tolerance;Ice applied;Monitored during session;Repositioned    Home Living Family/patient expects to be discharged to:: Private residence Living Arrangements: Alone Available Help at Discharge: Family;Friend(s);Available PRN/intermittently Type of Home: House Home Access: Stairs to enter Entrance Stairs-Rails: None Entrance Stairs-Number of Steps: 1 Home Layout: One level Home Equipment: Cane - single point      Prior Function Level of Independence: Independent               Hand Dominance   Dominant Hand: Right    Extremity/Trunk  Assessment   Upper Extremity Assessment Upper Extremity Assessment: Overall WFL for tasks assessed    Lower Extremity Assessment Lower Extremity Assessment: Overall WFL for tasks assessed;RLE deficits/detail RLE Deficits /  Details: suspected post-surgical hip weakness; able to perform quad set x1, ankle pumps, knee flexion to 45*.  RLE Sensation: WNL    Cervical / Trunk Assessment Cervical / Trunk Assessment: Normal  Communication   Communication: No difficulties  Cognition Arousal/Alertness: Awake/alert Behavior During Therapy: WFL for tasks assessed/performed Overall Cognitive Status: Within Functional Limits for tasks assessed                                        General Comments      Exercises     Assessment/Plan    PT Assessment Patient needs continued PT services  PT Problem List Decreased strength;Pain;Decreased range of motion;Decreased activity tolerance;Decreased knowledge of use of DME;Decreased balance;Decreased safety awareness;Decreased knowledge of precautions;Decreased mobility       PT Treatment Interventions DME instruction;Therapeutic activities;Gait training;Therapeutic exercise;Patient/family education;Stair training;Balance training;Functional mobility training    PT Goals (Current goals can be found in the Care Plan section)  Acute Rehab PT Goals PT Goal Formulation: With patient/family Time For Goal Achievement: 05/23/18 Potential to Achieve Goals: Good    Frequency 7X/week   Barriers to discharge        Co-evaluation               AM-PAC PT "6 Clicks" Daily Activity  Outcome Measure Difficulty turning over in bed (including adjusting bedclothes, sheets and blankets)?: Unable Difficulty moving from lying on back to sitting on the side of the bed? : Unable Difficulty sitting down on and standing up from a chair with arms (e.g., wheelchair, bedside commode, etc,.)?: Unable Help needed moving to and from a bed to chair (including a wheelchair)?: A Little Help needed walking in hospital room?: A Little Help needed climbing 3-5 steps with a railing? : A Lot 6 Click Score: 11    End of Session Equipment Utilized During Treatment: Gait  belt Activity Tolerance: Patient tolerated treatment well Patient left: in chair;with call bell/phone within reach;with family/visitor present;with SCD's reapplied;with chair alarm set Nurse Communication: Mobility status PT Visit Diagnosis: Other abnormalities of gait and mobility (R26.89);Difficulty in walking, not elsewhere classified (R26.2)    Time: 1308-6578 PT Time Calculation (min) (ACUTE ONLY): 32 min   Charges:   PT Evaluation $PT Eval Low Complexity: 1 Low PT Treatments $Gait Training: 8-22 mins       Nicola Police, PT Acute Rehabilitation Services Pager [phone]  Office [phone]  Najmah Carradine D Kaiana Marion 05/09/2018, 7:09 PM

## 2018-05-09 NOTE — Op Note (Signed)
05/09/2018  9:46 AM  PATIENT:  Jeffrey Garza   MRN: [id number]  PRE-OPERATIVE DIAGNOSIS: Right hip avascular necrosis  POST-OPERATIVE DIAGNOSIS:  same  PROCEDURE:  Procedure(s): RIGHT TOTAL HIP ARTHROPLASTY  PREOPERATIVE INDICATIONS:    Jeffrey Garza is an 72 y.o. male who has a diagnosis of right hip avascular necrosis and elected for surgical management after failing conservative treatment.  The risks benefits and alternatives were discussed with the patient including but not limited to the risks of nonoperative treatment, versus surgical intervention including infection, bleeding, nerve injury, periprosthetic fracture, the need for revision surgery, dislocation, leg length discrepancy, blood clots, cardiopulmonary complications, morbidity, mortality, among others, and they were willing to proceed.     OPERATIVE REPORT     SURGEON:  Marchia Bond, MD    ASSISTANT:  Joya Gaskins, OPA-C  (Present throughout the entire procedure,  necessary for completion of procedure in a timely manner, assisting with retraction, instrumentation, and closure)     ANESTHESIA: General  ESTIMATED BLOOD LOSS: 341 mL    COMPLICATIONS:  None.     UNIQUE ASPECTS OF THE CASE: The acetabulum had a fairly large fossa, in fact the smaller reamers would have fallen directly down into the fossa on the medial wall, such that I really could not start reaming until a fairly larger size, at least 52 if not the 54.  The augmentation screw did not have an amazing amount of purchase, but I had excellent press-fit with the cup.  The femoral stem was delivered directly down to the level of the grit blast, I had already gone from a size 6 to the size 7, his cortices tightened distally, and I felt that going up higher would have increased risk for periprosthetic fracture.  The femoral head did have a area of collapse superiorly, with delamination of the cartilage.  COMPONENTS:  [employer]uy [employer] fit femur size 7  with a 36 mm + 1.5 metallic head ball and a Gription Acetabular shell size 56, with a single cancellous screw for backup fixation, with an apex hole eliminator and a +4 neutral polyethylene liner.    PROCEDURE IN DETAIL:   The patient was met in the holding area and  identified.  The appropriate hip was identified and marked at the operative site.  The patient was then transported to the OR  and  placed under anesthesia.  At that point, the patient was  placed in the lateral decubitus position with the operative side up and  secured to the operating room table and all bony prominences padded.     The operative lower extremity was prepped from the iliac crest to the distal leg.  Sterile draping was performed.  Time out was performed prior to incision.      A routine posterolateral approach was utilized via sharp dissection  carried down to the subcutaneous tissue.  Gross bleeders were Bovie coagulated.  The iliotibial band was identified and incised along the length of the skin incision.  Self-retaining retractors were  inserted.  With the hip internally rotated, the short external rotators  were identified. The piriformis and capsule was tagged with FiberWire, and the hip capsule released in a T-type fashion.  The femoral neck was exposed, and I resected the femoral neck using the appropriate jig. This was performed at approximately a thumb's breadth above the lesser trochanter.    I then exposed the deep acetabulum, cleared out any tissue including the ligamentum teres.  A wing retractor  was placed.  After adequate visualization, I excised the labrum, and then sequentially reamed.  I placed the trial acetabulum, which seated nicely, and then impacted the real cup into place.  Appropriate version and inclination was confirmed clinically matching their bony anatomy, and also with the use of the jig.  I placed a cancellous screw to augment fixation.  A trial polyethylene liner was placed and the wing  retractor removed.    I then prepared the proximal femur using the cookie-cutter, the lateralizing reamer, and then sequentially reamed and broached.  A trial broach, neck, and head was utilized, and I reduced the hip and it was found to have excellent stability with functional range of motion. The trial components were then removed, and the real polyethylene liner was placed.  I then impacted the real femoral prosthesis into place into the appropriate version, slightly anteverted to the normal anatomy, and I impacted the real head ball into place. The hip was then reduced and taken through functional range of motion and found to have excellent stability. Leg lengths were restored.  I then used a 2 mm drill bits to pass the FiberWire suture from the capsule and piriformis through the greater trochanter, and secured this. Excellent posterior capsular repair was achieved. I also closed the T in the capsule.  I then irrigated the hip copiously again with pulse lavage, and repaired the fascia with Vicryl, followed by Vicryl for the subcutaneous tissue, Monocryl for the skin, Steri-Strips and sterile gauze. The wounds were injected. The patient was then awakened and returned to PACU in stable and satisfactory condition. There were no complications.  Marchia Bond, MD Orthopedic Surgeon [phone]   05/09/2018 9:46 AM

## 2018-05-09 NOTE — Progress Notes (Signed)
Pt. currently remains up in chair on room air, plan to recheck on rounds/made aware to notify when ready.

## 2018-05-09 NOTE — Care Plan (Signed)
Ortho Bundle Case Management Note  Patient Details  Name: Jeffrey Garza MRN: [id number] Date of Birth: [birth year]   Spoke with patient prior to surgery. He will discharge to home with family the following:   HHPT:   [facility] -- Phone # [phone]                  Fax# [phone]       Referral given to Diane.  OPPT:  [facility] on Louretta Parma 05/23/18 @ 1000.   Patient is aware and agreeable to above.                DME Arranged:  Dan Humphreys rolling DME Agency:  [employer]  HH Arranged:  PT HH [facility]:  [facility] - [phone] phone )  Additional Comments: Please contact me with any questions of if this plan should need to change.  Shauna Hugh,  RN,BSN,MHA,CCM  [facility]  [phone] 05/09/2018, 10:13 AM

## 2018-05-09 NOTE — Anesthesia Procedure Notes (Signed)
Procedure Name: Intubation Date/Time: 05/09/2018 7:41 AM Performed by: Thornell Mule, CRNA Pre-anesthesia Checklist: Patient identified, Emergency Drugs available, Suction available and Patient being monitored Patient Re-evaluated:Patient Re-evaluated prior to induction Oxygen Delivery Method: Circle system utilized Preoxygenation: Pre-oxygenation with 100% oxygen Induction Type: IV induction Ventilation: Mask ventilation without difficulty Laryngoscope Size: Miller and 3 Grade View: Grade I Tube type: Oral Tube size: 7.5 mm Number of attempts: 1 Airway Equipment and Method: Stylet and Oral airway Placement Confirmation: ETT inserted through vocal cords under direct vision,  positive ETCO2 and breath sounds checked- equal and bilateral Secured at: 21 cm Tube secured with: Tape Dental Injury: Teeth and Oropharynx as per pre-operative assessment

## 2018-05-09 NOTE — Anesthesia Postprocedure Evaluation (Signed)
Anesthesia Post Note  Patient: Jeffrey Garza  Procedure(s) Performed: RIGHT TOTAL HIP ARTHROPLASTY (Right Hip)     Patient location during evaluation: PACU Anesthesia Type: General Level of consciousness: awake and alert Pain management: pain level controlled Vital Signs Assessment: post-procedure vital signs reviewed and stable Respiratory status: spontaneous breathing, nonlabored ventilation, respiratory function stable and patient connected to nasal cannula oxygen Cardiovascular status: blood pressure returned to baseline and stable Postop Assessment: no apparent nausea or vomiting Anesthetic complications: no    Last Vitals:  Vitals:   05/09/18 1400 05/09/18 1456  BP: 131/86 (!) 131/94  Pulse: 66 67  Resp: 20 14  Temp: 36.6 C 36.7 C  SpO2: 100% 99%    Last Pain:  Vitals:   05/09/18 1456  TempSrc: Oral  PainSc:                  Phillips Grout

## 2018-05-09 NOTE — Discharge Instructions (Signed)

## 2018-05-09 NOTE — Plan of Care (Signed)
Plan of care 

## 2018-05-09 NOTE — Transfer of Care (Signed)
Immediate Anesthesia Transfer of Care Note  Patient: Jeffrey Garza  Procedure(s) Performed: RIGHT TOTAL HIP ARTHROPLASTY (Right Hip)  Patient Location: PACU  Anesthesia Type:General  Level of Consciousness: awake, alert  and oriented  Airway & Oxygen Therapy: Patient Spontanous Breathing and Patient connected to face mask oxygen  Post-op Assessment: Report given to RN and Post -op Vital signs reviewed and stable  Post vital signs: Reviewed and stable  Last Vitals:  Vitals Value Taken Time  BP    Temp    Pulse 116 05/09/2018 10:12 AM  Resp    SpO2 89 % 05/09/2018 10:12 AM  Vitals shown include unvalidated device data.  Last Pain:  Vitals:   05/09/18 0603  TempSrc: Oral      Patients Stated Pain Goal: 2 (05/09/18 1610)  Complications: No apparent anesthesia complications

## 2018-05-09 NOTE — Anesthesia Preprocedure Evaluation (Addendum)
Anesthesia Evaluation  Patient identified by MRN, date of birth, ID band Patient awake    Reviewed: Allergy & Precautions, NPO status , Patient's Chart, lab work & pertinent test results  Airway Mallampati: III  TM Distance: >3 FB Neck ROM: Full    Dental no notable dental hx. (+) Partial Upper   Pulmonary former smoker,    Pulmonary exam normal breath sounds clear to auscultation       Cardiovascular hypertension, Pt. on medications and Pt. on home beta blockers Normal cardiovascular exam+ dysrhythmias Atrial Fibrillation  Rhythm:Regular Rate:Normal     Neuro/Psych TIAnegative psych ROS   GI/Hepatic Neg liver ROS, GERD  Controlled,  Endo/Other  diabetes, Type 2  Renal/GU negative Renal ROS  negative genitourinary   Musculoskeletal negative musculoskeletal ROS (+)   Abdominal   Peds negative pediatric ROS (+)  Hematology negative hematology ROS (+)   Anesthesia Other Findings   Reproductive/Obstetrics negative OB ROS                             Anesthesia Physical Anesthesia Plan  ASA: III  Anesthesia Plan: General   Post-op Pain Management:    Induction: Intravenous  PONV Risk Score and Plan: 2 and Ondansetron and Treatment may vary due to age or medical condition  Airway Management Planned: Oral ETT  Additional Equipment:   Intra-op Plan:   Post-operative Plan: Extubation in OR  Informed Consent: I have reviewed the patients History and Physical, chart, labs and discussed the procedure including the risks, benefits and alternatives for the proposed anesthesia with the patient or authorized representative who has indicated his/her understanding and acceptance.   Dental advisory given  Plan Discussed with: CRNA  Anesthesia Plan Comments: (Not off Eliquis the required 72+ hrs. GA)        Anesthesia Quick Evaluation

## 2018-05-10 ENCOUNTER — Encounter (HOSPITAL_COMMUNITY): Payer: Self-pay | Admitting: Orthopedic Surgery

## 2018-05-10 LAB — CBC
HEMATOCRIT: 35.3 % — AB (ref 39.0–52.0)
Hemoglobin: 11.3 g/dL — ABNORMAL LOW (ref 13.0–17.0)
MCH: 29.7 pg (ref 26.0–34.0)
MCHC: 32 g/dL (ref 30.0–36.0)
MCV: 92.9 fL (ref 80.0–100.0)
NRBC: 0 % (ref 0.0–0.2)
PLATELETS: 172 10*3/uL (ref 150–400)
RBC: 3.8 MIL/uL — ABNORMAL LOW (ref 4.22–5.81)
RDW: 13.2 % (ref 11.5–15.5)
WBC: 14.1 10*3/uL — ABNORMAL HIGH (ref 4.0–10.5)

## 2018-05-10 LAB — BASIC METABOLIC PANEL
ANION GAP: 11 (ref 5–15)
BUN: 22 mg/dL (ref 8–23)
CO2: 25 mmol/L (ref 22–32)
Calcium: 8.8 mg/dL — ABNORMAL LOW (ref 8.9–10.3)
Chloride: 101 mmol/L (ref 98–111)
Creatinine, Ser: 1.1 mg/dL (ref 0.61–1.24)
GFR calc Af Amer: >60 mL/min (ref >60)
Glucose, Bld: 156 mg/dL — ABNORMAL HIGH (ref 70–99)
POTASSIUM: 4.5 mmol/L (ref 3.5–5.1)
SODIUM: 137 mmol/L (ref 135–145)

## 2018-05-10 MED ORDER — FOLIC ACID 1 MG PO TABS
500.0000 ug | ORAL_TABLET | Freq: Every day | ORAL | Status: DC
  Administered 2018-05-10 – 2018-05-11 (×2): 0.5 mg via ORAL
  Filled 2018-05-10 (×2): qty 1

## 2018-05-10 NOTE — Plan of Care (Signed)
  Problem: Health Behavior/Discharge Planning: Goal: Ability to manage health-related needs will improve Outcome: Progressing   Problem: Clinical Measurements: Goal: Ability to maintain clinical measurements within normal limits will improve Outcome: Progressing Goal: Will remain free from infection Outcome: Progressing Goal: Diagnostic test results will improve Outcome: Progressing Goal: Respiratory complications will improve Outcome: Progressing Goal: Cardiovascular complication will be avoided Outcome: Progressing   Problem: Activity: Goal: Risk for activity intolerance will decrease Outcome: Progressing   Problem: Nutrition: Goal: Adequate nutrition will be maintained Outcome: Progressing   Problem: Coping: Goal: Level of anxiety will decrease Outcome: Progressing   Problem: Elimination: Goal: Will not experience complications related to bowel motility Outcome: Progressing Goal: Will not experience complications related to urinary retention Outcome: Progressing   Problem: Pain Managment: Goal: General experience of comfort will improve Outcome: Progressing   Problem: Safety: Goal: Ability to remain free from injury will improve Outcome: Progressing   Problem: Education: Goal: Understanding of discharge needs will improve Outcome: Progressing Goal: Individualized Educational Video(s) Outcome: Progressing   Problem: Activity: Goal: Ability to avoid complications of mobility impairment will improve Outcome: Progressing Goal: Ability to tolerate increased activity will improve Outcome: Progressing   Problem: Clinical Measurements: Goal: Postoperative complications will be avoided or minimized Outcome: Progressing   Problem: Pain Management: Goal: Pain level will decrease with appropriate interventions Outcome: Progressing   Problem: Skin Integrity: Goal: Will show signs of wound healing Outcome: Progressing

## 2018-05-10 NOTE — Progress Notes (Addendum)
Patient ID: Jeffrey Garza, male   DOB: 12/15/1945, 72 y.o.   MRN: [id number]     Subjective:  Patient reports pain as mild.  Patient in the chair and in no acute distress.  Denies any CP or SOB  Objective:   VITALS:   Vitals:   05/09/18 2149 05/10/18 0155 05/10/18 0459 05/10/18 1016  BP: (!) 118/39 96/66 102/65 124/71  Pulse: 70 70 62 77  Resp: 14 16 16 16   Temp: 98.4 F (36.9 C) 98.9 F (37.2 C) 98.5 F (36.9 C) 98.2 F (36.8 C)  TempSrc: Oral Oral Oral Oral  SpO2: 99% 98% 98% 99%  Weight:      Height:        ABD soft Sensation intact distally Dorsiflexion/Plantar flexion intact Incision: dressing C/D/I and no drainage   Lab Results  Component Value Date   WBC 14.1 (H) 05/10/2018   HGB 11.3 (L) 05/10/2018   HCT 35.3 (L) 05/10/2018   MCV 92.9 05/10/2018   PLT 172 05/10/2018   BMET    Component Value Date/Time   NA 137 05/10/2018 0448   K 4.5 05/10/2018 0448   CL 101 05/10/2018 0448   CO2 25 05/10/2018 0448   GLUCOSE 156 (H) 05/10/2018 0448   BUN 22 05/10/2018 0448   CREATININE 1.10 05/10/2018 0448   CALCIUM 8.8 (L) 05/10/2018 0448   GFRNONAA >60 05/10/2018 0448   GFRAA >60 05/10/2018 0448     Assessment/Plan: 1 Day Post-Op   Principal Problem:   Avascular necrosis of bone of right hip (HCC) Active Problems:   Primary localized osteoarthritis of hip   Advance diet Up with therapy Plan for discharge tomorrow WBAT  Dry dressing PRN    Haskel Khan 05/10/2018, 1:26 PM  Reviewed and agree with above.   Teryl Lucy, MD Cell [phone]

## 2018-05-10 NOTE — Progress Notes (Signed)
Physical Therapy Treatment Patient Details Name: Jeffrey Garza MRN: [id number] DOB: July 12, 1946 Today's Date: 05/10/2018    History of Present Illness 72 YO male s/p R THA posterior approach on 05/09/18. PMH includes DM, OA, afib, GERD, HTN, RTC repair.     PT Comments    Pt motivated and progressing well with mobility.  This pm reviewed stair climbing and car transfers with pt and spouse.   Follow Up Recommendations  Follow surgeon's recommendation for DC plan and follow-up therapies;Supervision for mobility/OOB     Equipment Recommendations  Rolling walker with 5" wheels;3in1 (PT)    Recommendations for Other Services OT consult     Precautions / Restrictions Precautions Precautions: Fall;Posterior Hip Precaution Booklet Issued: Yes (comment) Precaution Comments: Reviewed all THP with pt  Restrictions Weight Bearing Restrictions: No Other Position/Activity Restrictions: WBAT     Mobility  Bed Mobility Overal bed mobility: Needs Assistance Bed Mobility: Sit to Supine     Supine to sit: Min guard Sit to supine: Min guard   General bed mobility comments: cues for sequence and adherence to THP  Transfers Overall transfer level: Needs assistance Equipment used: Rolling walker (2 wheeled) Transfers: Sit to/from Stand Sit to Stand: Min guard         General transfer comment: Cues for LE management, use of UEs to self assist and adherence to THP  Ambulation/Gait Ambulation/Gait assistance: Min guard;Supervision Gait Distance (Feet): 200 Feet Assistive device: Rolling walker (2 wheeled) Gait Pattern/deviations: Step-to pattern;Decreased stride length;Decreased stance time - right;Decreased weight shift to right;Antalgic Gait velocity: decr    General Gait Details: cues for sequence, posture, stride length and postion from RW   Stairs Stairs: Yes Stairs assistance: Min assist Stair Management: No rails;Forwards;With walker;Step to pattern Number of Stairs:  2 General stair comments: single step twice with RW and cues for RW/foot placement and sequence   Wheelchair Mobility    Modified Rankin (Stroke Patients Only)       Balance Overall balance assessment: Mild deficits observed, not formally tested                                          Cognition Arousal/Alertness: Awake/alert Behavior During Therapy: WFL for tasks assessed/performed Overall Cognitive Status: Within Functional Limits for tasks assessed                                        Exercises Total Joint Exercises Ankle Circles/Pumps: AROM;Both;15 reps;Supine Quad Sets: AROM;Both;10 reps Heel Slides: AAROM;Right;20 reps;Supine Hip ABduction/ADduction: AAROM;Right;15 reps;Supine    General Comments        Pertinent Vitals/Pain Pain Assessment: 0-10 Pain Score: 3  Faces Pain Scale: Hurts a little bit Pain Location: R hip Pain Descriptors / Indicators: Sore Pain Intervention(s): Premedicated before session;Monitored during session;Limited activity within patient's tolerance;Ice applied    Home Living Family/patient expects to be discharged to:: Private residence Living Arrangements: Alone Available Help at Discharge: Family;Friend(s);Available PRN/intermittently         Home Equipment: Cane - single point Additional Comments: BSC delivered as well as RW.  Someone will be with him most of the time    Prior Function Level of Independence: Independent          PT Goals (current goals can now be found in the care  plan section) Acute Rehab PT Goals Patient Stated Goal: Regain IND PT Goal Formulation: With patient/family Time For Goal Achievement: 05/23/18 Potential to Achieve Goals: Good Progress towards PT goals: Progressing toward goals    Frequency    7X/week      PT Plan Current plan remains appropriate    Co-evaluation              AM-PAC PT "6 Clicks" Daily Activity  Outcome Measure  Difficulty  turning over in bed (including adjusting bedclothes, sheets and blankets)?: Unable Difficulty moving from lying on back to sitting on the side of the bed? : A Lot Difficulty sitting down on and standing up from a chair with arms (e.g., wheelchair, bedside commode, etc,.)?: A Lot Help needed moving to and from a bed to chair (including a wheelchair)?: A Little Help needed walking in hospital room?: A Little Help needed climbing 3-5 steps with a railing? : A Little 6 Click Score: 14    End of Session Equipment Utilized During Treatment: Gait belt Activity Tolerance: Patient tolerated treatment well Patient left: in bed;with call bell/phone within reach;with family/visitor present Nurse Communication: Mobility status PT Visit Diagnosis: Other abnormalities of gait and mobility (R26.89);Difficulty in walking, not elsewhere classified (R26.2)     Time: 1478-2956 PT Time Calculation (min) (ACUTE ONLY): 26 min  Charges:  $Gait Training: 8-22 mins $Therapeutic Exercise: 8-22 mins $Therapeutic Activity: 8-22 mins                     Jeffrey Garza PT Acute Rehabilitation Services Pager [phone] Office [phone]    Kazim Corrales 05/10/2018, 3:50 PM

## 2018-05-10 NOTE — Evaluation (Signed)
Occupational Therapy Evaluation Patient Details Name: Jeffrey Garza MRN: [id number] DOB: 16-Jan-1946 Today's Date: 05/10/2018    History of Present Illness 72 YO male s/p R THA posterior approach on 05/09/18. PMH includes DM, OA, afib, GERD, HTN, RTC repair.    Clinical Impression   Pt was admitted for the above. At baseline, he is independent with adls. He currently needs min to mod A with AE.  Pt will have almost 24/7 initially. Will follow in acute setting with min guard level goals    Follow Up Recommendations  Supervision/Assistance - 24 hour    Equipment Recommendations  3 in 1 bedside commode    Recommendations for Other Services       Precautions / Restrictions Precautions Precautions: Fall;Posterior Hip Precaution Booklet Issued: Yes (comment) Precaution Comments: Reviewed all THP with pt  Restrictions Weight Bearing Restrictions: No Other Position/Activity Restrictions: WBAT       Mobility Bed Mobility         Supine to sit: Min guard Sit to supine: Min guard   General bed mobility comments: for safety; precautions.  Pt tends to hold breathe  Transfers Overall transfer level: Needs assistance Equipment used: Rolling walker (2 wheeled) Transfers: Sit to/from Stand Sit to Stand: Min assist         General transfer comment: Cues for LE management, use of UEs to self assist and adherence to THP    Balance Overall balance assessment: Mild deficits observed, not formally tested                                         ADL either performed or assessed with clinical judgement   ADL Overall ADL's : Needs assistance/impaired             Lower Body Bathing: Minimal assistance;Sit to/from stand;With adaptive equipment       Lower Body Dressing: Moderate assistance;Sit to/from stand;With adaptive equipment                 General ADL Comments: educated pt on posterior THPs and ADLs/bathroom transfers. Pt tried reacher and  sock aide.  He can perform UB adls with set up. A 3:1 was delivered to his room. Pt states he cannot shower x 2 weeks. He plans to sponge bathe. Demonstrated sidestepping into tub when he is ready.  Educated he can stand at sink with assistance to wash hair and also to stand for toilet hygiene     Vision         Perception     Praxis      Pertinent Vitals/Pain Pain Assessment: 0-10 Pain Score: 4  Faces Pain Scale: Hurts a little bit Pain Location: R hip Pain Descriptors / Indicators: Sore Pain Intervention(s): Limited activity within patient's tolerance;Monitored during session;Premedicated before session;Repositioned     Hand Dominance Right   Extremity/Trunk Assessment Upper Extremity Assessment Upper Extremity Assessment: Overall WFL for tasks assessed           Communication Communication Communication: No difficulties   Cognition Arousal/Alertness: Awake/alert Behavior During Therapy: WFL for tasks assessed/performed Overall Cognitive Status: Within Functional Limits for tasks assessed                                     General Comments       Exercises  Shoulder Instructions      Home Living Family/patient expects to be discharged to:: Private residence Living Arrangements: Alone Available Help at Discharge: Family;Friend(s);Available PRN/intermittently               Bathroom Shower/Tub: Chief Strategy Officer: Standard     Home Equipment: Cane - single point   Additional Comments: BSC delivered as well as RW.  Someone will be with him most of the time      Prior Functioning/Environment Level of Independence: Independent                 OT Problem List: Decreased knowledge of use of DME or AE;Decreased knowledge of precautions;Pain      OT Treatment/Interventions: Self-care/ADL training;DME and/or AE instruction;Patient/family education    OT Goals(Current goals can be found in the care plan section)  Acute Rehab OT Goals Patient Stated Goal: Regain IND OT Goal Formulation: With patient Time For Goal Achievement: 05/24/18 Potential to Achieve Goals: Good ADL Goals Pt Will Perform Lower Body Bathing: with min guard assist;with adaptive equipment;sit to/from stand Pt Will Perform Lower Body Dressing: with min guard assist;with adaptive equipment;sit to/from stand Pt Will Transfer to Toilet: with min guard assist;ambulating;bedside commode Pt Will Perform Toileting - Clothing Manipulation and hygiene: with min guard assist;sit to/from stand  OT Frequency: Min 2X/week   Barriers to D/C:            Co-evaluation              AM-PAC PT "6 Clicks" Daily Activity     Outcome Measure Help from another person eating meals?: None Help from another person taking care of personal grooming?: A Little Help from another person toileting, which includes using toliet, bedpan, or urinal?: A Little Help from another person bathing (including washing, rinsing, drying)?: A Little Help from another person to put on and taking off regular upper body clothing?: A Little Help from another person to put on and taking off regular lower body clothing?: A Lot 6 Click Score: 18   End of Session    Activity Tolerance: Patient tolerated treatment well Patient left: in bed;with call bell/phone within reach;with family/visitor present  OT Visit Diagnosis: Pain Pain - Right/Left: Right Pain - part of body: Hip                Time: 4098-1191 OT Time Calculation (min): 29 min Charges:  OT General Charges $OT Visit: 1 Visit OT Evaluation $OT Eval Low Complexity: 1 Low OT Treatments $Self Care/Home Management : 8-22 mins  Jeffrey Garza, OTR/L Acute Rehabilitation Services [phone] [facility] pager [phone] office 05/10/2018  Jeffrey Garza 05/10/2018, 3:31 PM

## 2018-05-10 NOTE — Progress Notes (Signed)
Physical Therapy Treatment Patient Details Name: Jeffrey Garza MRN: [id number] DOB: [birth year] Today's Date: 05/10/2018    History of Present Illness 72 YO male s/p R THA posterior approach on 05/09/18. PMH includes DM, OA, afib, GERD, HTN, RTC repair.     PT Comments    Pt motivated and progressing steadily with mobility.  Wife present and reviewed posterior THP and WBAT status.   Follow Up Recommendations  Follow surgeon's recommendation for DC plan and follow-up therapies;Supervision for mobility/OOB     Equipment Recommendations  Rolling walker with 5" wheels;3in1 (PT)    Recommendations for Other Services OT consult     Precautions / Restrictions Precautions Precautions: Fall;Posterior Hip Precaution Booklet Issued: Yes (comment) Precaution Comments: Reviewed all THP with pt and spouse Restrictions Weight Bearing Restrictions: No Other Position/Activity Restrictions: WBAT     Mobility  Bed Mobility               General bed mobility comments: Up in chair and requests back to same  Transfers Overall transfer level: Needs assistance Equipment used: Rolling walker (2 wheeled) Transfers: Sit to/from Stand Sit to Stand: Min assist         General transfer comment: Cues for LE management, use of UEs to self assist and adherence to THP  Ambulation/Gait Ambulation/Gait assistance: Min guard Gait Distance (Feet): 111 Feet Assistive device: Rolling walker (2 wheeled) Gait Pattern/deviations: Step-to pattern;Decreased stride length;Decreased stance time - right;Decreased weight shift to right;Antalgic Gait velocity: decr    General Gait Details: cues for sequence, posture, stride length and postion from RW   Stairs             Wheelchair Mobility    Modified Rankin (Stroke Patients Only)       Balance Overall balance assessment: Mild deficits observed, not formally tested                                           Cognition Arousal/Alertness: Awake/alert Behavior During Therapy: WFL for tasks assessed/performed Overall Cognitive Status: Within Functional Limits for tasks assessed                                        Exercises Total Joint Exercises Ankle Circles/Pumps: AROM;Both;15 reps;Supine Quad Sets: AROM;Both;10 reps Heel Slides: AAROM;Right;20 reps;Supine Hip ABduction/ADduction: AAROM;Right;15 reps;Supine    General Comments        Pertinent Vitals/Pain Pain Assessment: 0-10 Pain Score: 4  Pain Location: R hip, during ambulation  Pain Descriptors / Indicators: Sore Pain Intervention(s): Limited activity within patient's tolerance;Monitored during session;Premedicated before session;Ice applied    Home Living                      Prior Function            PT Goals (current goals can now be found in the care plan section) Acute Rehab PT Goals Patient Stated Goal: Regain IND PT Goal Formulation: With patient/family Time For Goal Achievement: 05/23/18 Potential to Achieve Goals: Good Progress towards PT goals: Progressing toward goals    Frequency    7X/week      PT Plan Current plan remains appropriate    Co-evaluation              AM-PAC PT "6  Clicks" Daily Activity  Outcome Measure  Difficulty turning over in bed (including adjusting bedclothes, sheets and blankets)?: Unable Difficulty moving from lying on back to sitting on the side of the bed? : Unable Difficulty sitting down on and standing up from a chair with arms (e.g., wheelchair, bedside commode, etc,.)?: A Lot Help needed moving to and from a bed to chair (including a wheelchair)?: A Little Help needed walking in hospital room?: A Little Help needed climbing 3-5 steps with a railing? : A Little 6 Click Score: 13    End of Session Equipment Utilized During Treatment: Gait belt Activity Tolerance: Patient tolerated treatment well Patient left: in chair;with call  bell/phone within reach;with family/visitor present;with SCD's reapplied;with chair alarm set Nurse Communication: Mobility status PT Visit Diagnosis: Other abnormalities of gait and mobility (R26.89);Difficulty in walking, not elsewhere classified (R26.2)     Time: 1100-1130 PT Time Calculation (min) (ACUTE ONLY): 30 min  Charges:  $Gait Training: 8-22 mins $Therapeutic Exercise: 8-22 mins                     Mauro Kaufmann PT Acute Rehabilitation Services Pager [phone] Office [phone]    Eusebio Blazejewski 05/10/2018, 12:18 PM

## 2018-05-10 NOTE — Progress Notes (Signed)
Pt. now weaned off n/c oxygen, own hose/nasal mask, able to place on independently, made aware to notify if needed, family members remains at bedside, RT to monitor.

## 2018-05-11 LAB — BASIC METABOLIC PANEL
ANION GAP: 8 (ref 5–15)
BUN: 19 mg/dL (ref 8–23)
CALCIUM: 8.7 mg/dL — AB (ref 8.9–10.3)
CO2: 26 mmol/L (ref 22–32)
CREATININE: 0.85 mg/dL (ref 0.61–1.24)
Chloride: 103 mmol/L (ref 98–111)
Glucose, Bld: 135 mg/dL — ABNORMAL HIGH (ref 70–99)
Potassium: 4.5 mmol/L (ref 3.5–5.1)
Sodium: 137 mmol/L (ref 135–145)

## 2018-05-11 LAB — CBC
HEMATOCRIT: 35.7 % — AB (ref 39.0–52.0)
Hemoglobin: 11.6 g/dL — ABNORMAL LOW (ref 13.0–17.0)
MCH: 30 pg (ref 26.0–34.0)
MCHC: 32.5 g/dL (ref 30.0–36.0)
MCV: 92.2 fL (ref 80.0–100.0)
NRBC: 0 % (ref 0.0–0.2)
PLATELETS: 191 10*3/uL (ref 150–400)
RBC: 3.87 MIL/uL — ABNORMAL LOW (ref 4.22–5.81)
RDW: 13.2 % (ref 11.5–15.5)
WBC: 16.4 10*3/uL — ABNORMAL HIGH (ref 4.0–10.5)

## 2018-05-11 NOTE — Discharge Summary (Signed)
Physician Discharge Summary  Patient ID: [name] MRN: [id number] DOB/AGE: July 11, 1946 72 y.o.  Admit date: 05/09/2018 Discharge date: 05/11/2018  Admission Diagnoses:  Avascular necrosis of bone of right hip Jeffrey Garza)  Discharge Diagnoses:  Principal Problem:   Avascular necrosis of bone of right hip (HCC) Active Problems:   Primary localized osteoarthritis of hip   Past Medical History:  Diagnosis Date  . Arthritis   . Avascular necrosis of bone of right hip (HCC) 05/09/2018  . Barrett's esophagus   . Diabetes mellitus without complication (HCC)    no meds / weight loss   . Dysrhythmia    a-fib  . GERD (gastroesophageal reflux disease)   . History of hiatal hernia   . Hypertension     Surgeries: Procedure(s): RIGHT TOTAL HIP ARTHROPLASTY on 05/09/2018   Consultants (if any):   Discharged Condition: Improved  Hospital Course: Jeffrey Garza is an 72 y.o. male who was admitted 05/09/2018 with a diagnosis of Avascular necrosis of bone of right hip (HCC) and went to the operating room on 05/09/2018 and underwent the above named procedures.    He was given perioperative antibiotics:  Anti-infectives (From admission, onward)   Start     Dose/Rate Route Frequency Ordered Stop   05/09/18 1415  ceFAZolin (ANCEF) IVPB 2g/100 mL premix     2 g 200 mL/hr over 30 Minutes Intravenous Every 6 hours 05/09/18 1402 05/09/18 2013   05/09/18 0600  ceFAZolin (ANCEF) IVPB 2g/100 mL premix     2 g 200 mL/hr over 30 Minutes Intravenous On call to O.R. 05/09/18 0543 05/09/18 0813    .  He was given sequential compression devices, early ambulation, and eliquis for DVT prophylaxis.  He benefited maximally from the hospital stay and there were no complications.    Recent vital signs:  Vitals:   05/10/18 2149 05/11/18 0504  BP: 133/78 133/79  Pulse: 77 81  Resp: 15 17  Temp: 98.6 F (37 C) 98.3 F (36.8 C)  SpO2: 99% 100%    Recent laboratory studies:  Lab Results  Component  Value Date   HGB 11.6 (L) 05/11/2018   HGB 11.3 (L) 05/10/2018   HGB 13.7 04/26/2018   Lab Results  Component Value Date   WBC 16.4 (H) 05/11/2018   PLT 191 05/11/2018   No results found for: INR Lab Results  Component Value Date   NA 137 05/11/2018   K 4.5 05/11/2018   CL 103 05/11/2018   CO2 26 05/11/2018   BUN 19 05/11/2018   CREATININE 0.85 05/11/2018   GLUCOSE 135 (H) 05/11/2018    Discharge Medications:   Allergies as of 05/11/2018      Reactions   Amoxicillin Rash   Has patient had a PCN reaction causing immediate rash, facial/tongue/throat swelling, SOB or lightheadedness with hypotension: Yes Has patient had a PCN reaction causing severe rash involving mucus membranes or skin necrosis: No Has patient had a PCN reaction that required hospitalization: Yes Has patient had a PCN reaction occurring within the last 10 years: No If all of the above answers are "NO", then may proceed with Cephalosporin use.      Medication List    STOP taking these medications   acetaminophen 500 MG tablet Commonly known as:  TYLENOL     TAKE these medications   baclofen 10 MG tablet Commonly known as:  LIORESAL Take 1 tablet (10 mg total) by mouth 3 (three) times daily. As needed for muscle spasm  ELIQUIS 5 MG Tabs tablet Generic drug:  apixaban Take 5 mg by mouth 2 (two) times daily.   folic acid 400 MCG tablet Commonly known as:  FOLVITE Take 400 mcg by mouth daily.   HYDROcodone-acetaminophen 10-325 MG tablet Commonly known as:  NORCO Take 1 tablet by mouth every 6 (six) hours as needed.   metoprolol tartrate 25 MG tablet Commonly known as:  LOPRESSOR Take 25 mg by mouth 2 (two) times daily.   ondansetron 4 MG tablet Commonly known as:  ZOFRAN Take 1 tablet (4 mg total) by mouth every 8 (eight) hours as needed for nausea or vomiting.   pantoprazole 40 MG tablet Commonly known as:  PROTONIX Take 40 mg by mouth daily.   rosuvastatin 20 MG tablet Commonly known  as:  CRESTOR Take 20 mg by mouth daily.   sennosides-docusate sodium 8.6-50 MG tablet Commonly known as:  SENOKOT-S Take 2 tablets by mouth daily.       Diagnostic Studies: Dg Pelvis Portable  Result Date: 05/09/2018 CLINICAL DATA:  Post right hip replacement EXAM: PORTABLE PELVIS 1-2 VIEWS COMPARISON:  None. FINDINGS: Changes of right hip replacement. Soft tissue gas noted. Normal AP alignment. No visible hardware or bony complicating feature. Mild degenerative changes in the left hip. SI joints are symmetric and unremarkable. IMPRESSION: Right hip replacement.  No complicating feature. Electronically Signed   By: Charlett Nose M.D.   On: 05/09/2018 11:01   Dg Hip Port Unilat With Pelvis 1v Right  Result Date: 05/09/2018 CLINICAL DATA:  Post right hip replacement EXAM: DG HIP (WITH OR WITHOUT PELVIS) 1V PORT RIGHT COMPARISON:  None. FINDINGS: Changes of right hip replacement. Normal AP alignment. No hardware complicating feature. IMPRESSION: Right hip replacement.  No visible complicating feature. Electronically Signed   By: Charlett Nose M.D.   On: 05/09/2018 11:01    Disposition: Discharge disposition: 01-Home or Self Care         Follow-up Information    Teryl Lucy, MD. Go on 05/22/2018.   Specialty:  Orthopedic Surgery Why:  Your appointment with Dr. Dion Saucier is at 9:00  Contact information: [address] [place] [address] [phone]        Rehab, [facility] .   Specialties:  Physical Therapy, Occupational Therapy Contact information: [address] [place] [address] [phone]        [facility]. Go on 05/23/2018.   Why:  You will start OPPT on 05/23/18 at 10:00. Please arrive early to complete paperwork.  Contact information: [place]   [phone]       Care, [facility] Follow up.   Why:  HHPT will make 5 visits prior to your follow up with Dr. Dion Saucier.   Contact information: [address] [place] [address] [phone]            Signed: Eulas Post 05/11/2018, 9:06 AM

## 2018-05-11 NOTE — Progress Notes (Signed)
Physical Therapy Treatment Patient Details Name: Jeffrey Garza MRN: [id number] DOB: October 04, 1945 Today's Date: 05/11/2018    History of Present Illness 72 YO male s/p R THA posterior approach on 05/09/18. PMH includes DM, OA, afib, GERD, HTN, RTC repair.     PT Comments    Pt motivated and progressing well with mobility.  Reviewed stairs, car transfers, and home therex with pt.   Follow Up Recommendations  Follow surgeon's recommendation for DC plan and follow-up therapies;Supervision for mobility/OOB     Equipment Recommendations  Rolling walker with 5" wheels;3in1 (PT)    Recommendations for Other Services OT consult     Precautions / Restrictions Precautions Precautions: Fall;Posterior Hip Precaution Booklet Issued: Yes (comment) Precaution Comments: Reviewed all THP with pt  Restrictions Weight Bearing Restrictions: No Other Position/Activity Restrictions: WBAT     Mobility  Bed Mobility Overal bed mobility: Needs Assistance Bed Mobility: Supine to Sit;Sit to Supine     Supine to sit: Min guard;Supervision Sit to supine: Supervision   General bed mobility comments: min cues for sequence and use of L LE to self assist  Transfers Overall transfer level: Needs assistance Equipment used: Rolling walker (2 wheeled) Transfers: Sit to/from Stand Sit to Stand: Supervision         General transfer comment: cues for LE placement  Ambulation/Gait Ambulation/Gait assistance: Supervision Gait Distance (Feet): 350 Feet Assistive device: Rolling walker (2 wheeled) Gait Pattern/deviations: Decreased stride length;Decreased stance time - right;Decreased weight shift to right;Antalgic;Step-to pattern;Step-through pattern Gait velocity: decr    General Gait Details: cues for sequence, posture, stride length and postion from RW   Stairs Stairs: Yes Stairs assistance: Min guard Stair Management: No rails;Forwards;With walker;Step to pattern Number of Stairs:  2 General stair comments: single step twice with RW and cues for RW/foot placement and sequence   Wheelchair Mobility    Modified Rankin (Stroke Patients Only)       Balance Overall balance assessment: Mild deficits observed, not formally tested                                          Cognition Arousal/Alertness: Awake/alert Behavior During Therapy: WFL for tasks assessed/performed Overall Cognitive Status: Within Functional Limits for tasks assessed                                        Exercises Total Joint Exercises Ankle Circles/Pumps: AROM;Both;15 reps;Supine Quad Sets: AROM;Both;10 reps Heel Slides: AAROM;Right;20 reps;Supine Hip ABduction/ADduction: AAROM;Right;15 reps;Supine Long Arc Quad: AROM;Right;Seated;20 reps    General Comments        Pertinent Vitals/Pain Pain Assessment: 0-10 Pain Score: 2  Pain Location: R hip Pain Descriptors / Indicators: Sore Pain Intervention(s): Limited activity within patient's tolerance;Monitored during session;Premedicated before session;Ice applied    Home Living                      Prior Function            PT Goals (current goals can now be found in the care plan section) Acute Rehab PT Goals Patient Stated Goal: Regain IND PT Goal Formulation: With patient/family Time For Goal Achievement: 05/23/18 Potential to Achieve Goals: Good Progress towards PT goals: Progressing toward goals    Frequency    7X/week  PT Plan Current plan remains appropriate    Co-evaluation              AM-PAC PT "6 Clicks" Daily Activity  Outcome Measure  Difficulty turning over in bed (including adjusting bedclothes, sheets and blankets)?: A Lot Difficulty moving from lying on back to sitting on the side of the bed? : A Lot Difficulty sitting down on and standing up from a chair with arms (e.g., wheelchair, bedside commode, etc,.)?: A Lot Help needed moving to and from  a bed to chair (including a wheelchair)?: A Little Help needed walking in hospital room?: A Little Help needed climbing 3-5 steps with a railing? : A Little 6 Click Score: 15    End of Session Equipment Utilized During Treatment: Gait belt Activity Tolerance: Patient tolerated treatment well Patient left: Other (comment);with family/visitor present(sitting EOB) Nurse Communication: Mobility status PT Visit Diagnosis: Other abnormalities of gait and mobility (R26.89);Difficulty in walking, not elsewhere classified (R26.2)     Time: 1610-9604 PT Time Calculation (min) (ACUTE ONLY): 30 min  Charges:  $Gait Training: 8-22 mins $Therapeutic Exercise: 8-22 mins                     Mauro Kaufmann PT Acute Rehabilitation Services Pager [phone] Office [phone]    Jakaya Jacobowitz 05/11/2018, 2:20 PM

## 2018-05-11 NOTE — Progress Notes (Signed)
Occupational Therapy Treatment Patient Details Name: DHYAN NOAH MRN: [id number] DOB: Jan 10, 1946 Today's Date: 05/11/2018    History of present illness 72 YO male s/p R THA posterior approach on 05/09/18. PMH includes DM, OA, afib, GERD, HTN, RTC repair.    OT comments  Completed education today.  Follow Up Recommendations  Supervision/Assistance - 24 hour    Equipment Recommendations  3 in 1 bedside commode    Recommendations for Other Services      Precautions / Restrictions Precautions Precautions: Fall;Posterior Hip Precaution Comments: Reviewed all THP with pt  Restrictions Other Position/Activity Restrictions: WBAT        Mobility Bed Mobility               General bed mobility comments: sitting EOB  Transfers   Equipment used: Rolling walker (2 wheeled)   Sit to Stand: Supervision         General transfer comment: cues for LE placement    Balance                                           ADL either performed or assessed with clinical judgement   ADL       Grooming: Supervision/safety;Standing;Wash/dry hands;Wash/dry face;Oral care               Lower Body Dressing: Minimal assistance;Sit to/from stand(pants with reacher)   Toilet Transfer: Supervision/safety;Ambulation;BSC;RW             General ADL Comments: cues for LE placement during sit to stand. Pt got dressed but wanted to wait until he was home to wash up.  Daughter present and did not have any questions for OT. Pt states he won't take a shower for 10 days. Plans to wash at sink.  Reviewed technique to side step over tub     Vision       Perception     Praxis      Cognition Arousal/Alertness: Awake/alert Behavior During Therapy: WFL for tasks assessed/performed Overall Cognitive Status: Within Functional Limits for tasks assessed                                          Exercises     Shoulder Instructions        General Comments      Pertinent Vitals/ Pain       Pain Score: 2  Pain Location: R hip Pain Descriptors / Indicators: Sore Pain Intervention(s): Limited activity within patient's tolerance;Monitored during session;Premedicated before session;Repositioned  Home Living                                          Prior Functioning/Environment              Frequency           Progress Toward Goals  OT Goals(current goals can now be found in the care plan section)  Progress towards OT goals: Progressing toward goals     Plan      Co-evaluation                 AM-PAC PT "6 Clicks" Daily Activity     Outcome Measure  Help from another person eating meals?: None Help from another person taking care of personal grooming?: A Little Help from another person toileting, which includes using toliet, bedpan, or urinal?: A Little Help from another person bathing (including washing, rinsing, drying)?: A Little Help from another person to put on and taking off regular upper body clothing?: A Little Help from another person to put on and taking off regular lower body clothing?: A Lot 6 Click Score: 18    End of Session    OT Visit Diagnosis: Pain Pain - Right/Left: Right Pain - part of body: Hip   Activity Tolerance Patient tolerated treatment well   Patient Left (handed off to PT)   Nurse Communication          Time: 1610-9604 OT Time Calculation (min): 28 min  Charges: OT General Charges $OT Visit: 1 Visit OT Treatments $Self Care/Home Management : 23-37 mins  Marica Otter, OTR/L Acute Rehabilitation Services [phone] [facility] pager [phone] office 05/11/2018   Dayvian Blixt 05/11/2018, 11:03 AM

## 2018-05-11 NOTE — Progress Notes (Signed)
Patient ID: Jeffrey Garza, male   DOB: 1946/05/25, 72 y.o.   MRN: [id number]     Subjective:  Patient reports pain as mild.  Patient in bed and in no acute distress denies any CP or SOB  Objective:   VITALS:   Vitals:   05/10/18 1016 05/10/18 1330 05/10/18 2149 05/11/18 0504  BP: 124/71 132/81 133/78 133/79  Pulse: 77 78 77 81  Resp: 16 18 15 17   Temp: 98.2 F (36.8 C) 98.1 F (36.7 C) 98.6 F (37 C) 98.3 F (36.8 C)  TempSrc: Oral Oral Oral Oral  SpO2: 99% 98% 99% 100%  Weight:      Height:        ABD soft Sensation intact distally Dorsiflexion/Plantar flexion intact Incision: dressing C/D/I and no drainage Dressing changed and wound good  Lab Results  Component Value Date   WBC 16.4 (H) 05/11/2018   HGB 11.6 (L) 05/11/2018   HCT 35.7 (L) 05/11/2018   MCV 92.2 05/11/2018   PLT 191 05/11/2018   BMET    Component Value Date/Time   NA 137 05/11/2018 0538   K 4.5 05/11/2018 0538   CL 103 05/11/2018 0538   CO2 26 05/11/2018 0538   GLUCOSE 135 (H) 05/11/2018 0538   BUN 19 05/11/2018 0538   CREATININE 0.85 05/11/2018 0538   CALCIUM 8.7 (L) 05/11/2018 0538   GFRNONAA >60 05/11/2018 0538   GFRAA >60 05/11/2018 0538     Assessment/Plan: 2 Days Post-Op   Principal Problem:   Avascular necrosis of bone of right hip (HCC) Active Problems:   Primary localized osteoarthritis of hip   Advance diet Up with therapy Discharge home with home health WBAT Dry dressing PRN Follow up with Dr Dion Saucier as scheduled or call for appointment in two weeks    Haskel Khan 05/11/2018, 6:54 AM   Teryl Lucy, MD Cell [phone]

## 2018-05-11 NOTE — Progress Notes (Signed)
RN reviewed discharge instructions with patient and daughter.   Patient was informed 2 different things about showering by the MD and the PA. PA stated no showering, MD stated showering with the dressing covered. No clear instructions in the discharge packet, therefore RN requested that patient/family call MD office to clarify.   Paperwork given to patient; prescriptions sent to pharmacy by physician.   NT rolled patient down with all belongings to family car.

## 2020-02-01 IMAGING — CR DG HIP (WITH OR WITHOUT PELVIS) 1V PORT*R*
1 series · 1 of 1 positions shown · non-contrast
Comparison: None.

CLINICAL DATA: Post right hip replacement

EXAM:
DG HIP (WITH OR WITHOUT PELVIS) 1V PORT RIGHT

[x hip ap right]
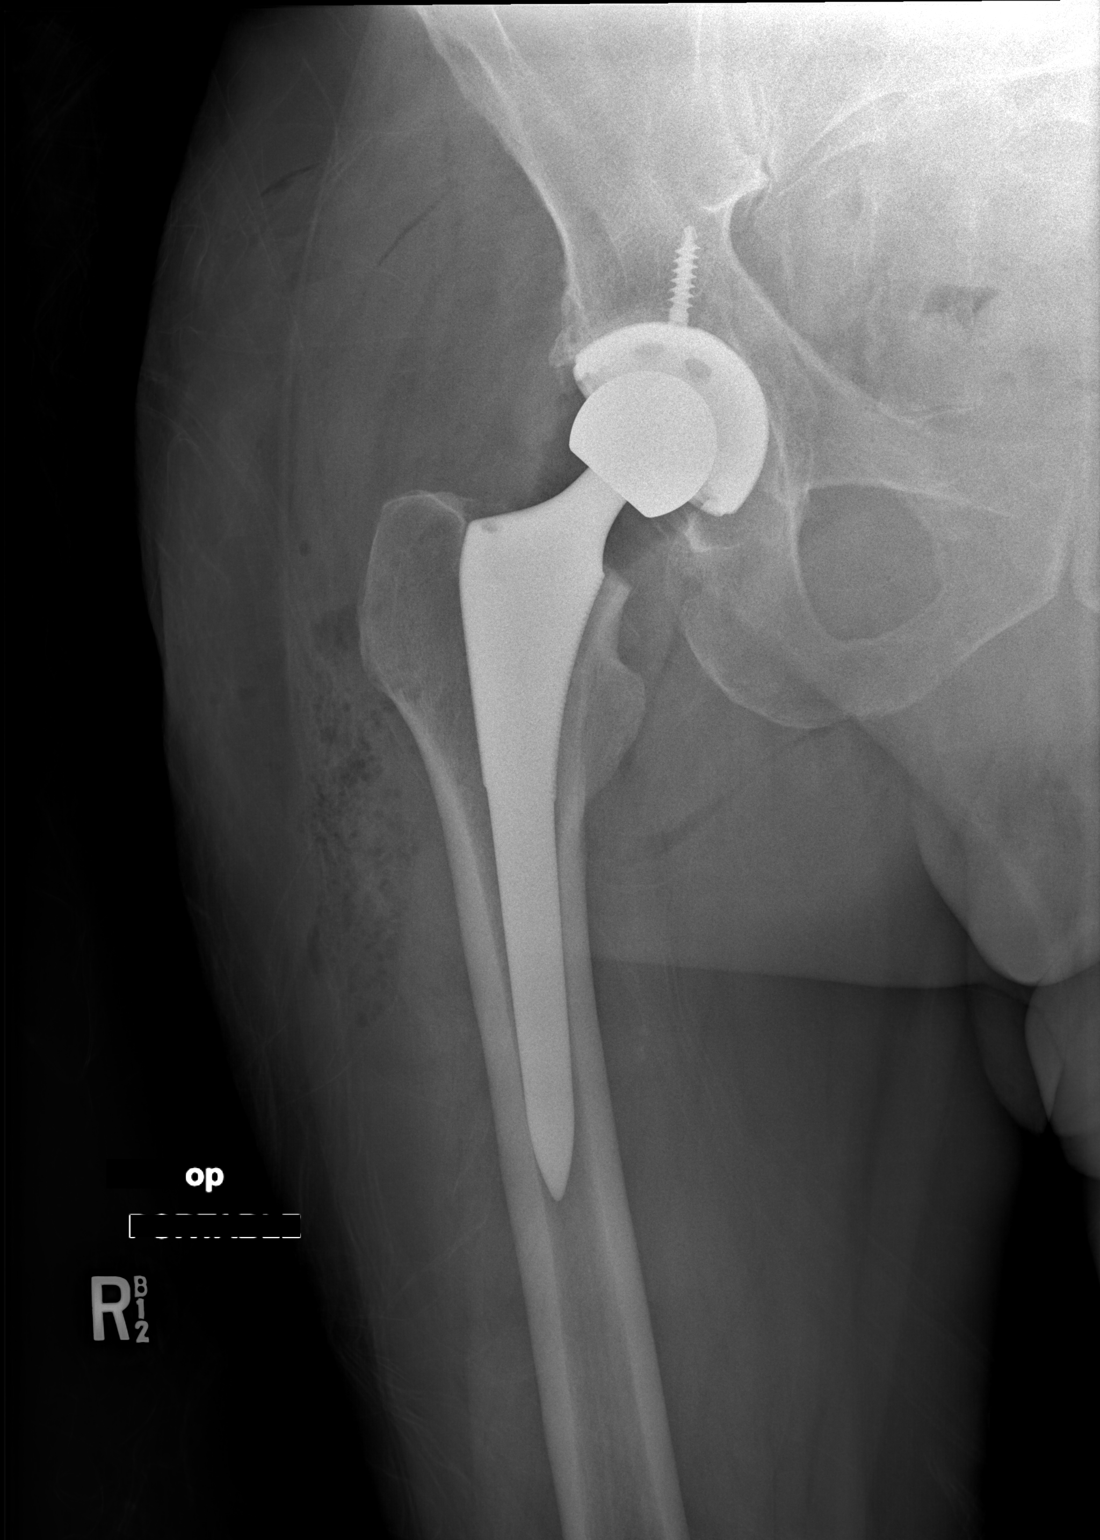

[1 of 1 positions shown; findings below may reference images not displayed]

FINDINGS: Changes of right hip replacement. Normal AP alignment. No hardware
complicating feature.
IMPRESSION: Right hip replacement.  No visible complicating feature.
